# Patient Record
Sex: Female | Born: 1970 | Race: White | Hispanic: No | Marital: Married | Smoking: Current every day smoker
Health system: Southern US, Community
[De-identification: ages and names within clinical notes are randomized; demographics above are authoritative.]

## PROBLEM LIST (undated history)

## (undated) DIAGNOSIS — D649 Anemia, unspecified: Secondary | ICD-10-CM

## (undated) DIAGNOSIS — E079 Disorder of thyroid, unspecified: Secondary | ICD-10-CM

## (undated) DIAGNOSIS — J45909 Unspecified asthma, uncomplicated: Secondary | ICD-10-CM

## (undated) DIAGNOSIS — E785 Hyperlipidemia, unspecified: Secondary | ICD-10-CM

## (undated) DIAGNOSIS — I2699 Other pulmonary embolism without acute cor pulmonale: Secondary | ICD-10-CM

## (undated) DIAGNOSIS — F419 Anxiety disorder, unspecified: Secondary | ICD-10-CM

## (undated) DIAGNOSIS — R42 Dizziness and giddiness: Secondary | ICD-10-CM

## (undated) DIAGNOSIS — M1990 Unspecified osteoarthritis, unspecified site: Secondary | ICD-10-CM

## (undated) DIAGNOSIS — K219 Gastro-esophageal reflux disease without esophagitis: Secondary | ICD-10-CM

## (undated) DIAGNOSIS — J189 Pneumonia, unspecified organism: Secondary | ICD-10-CM

## (undated) HISTORY — DX: Pneumonia, unspecified organism: J18.9

## (undated) HISTORY — DX: Other pulmonary embolism without acute cor pulmonale: I26.99

## (undated) HISTORY — PX: BRACHIAL EMBOLECTOMY: SHX1259

## (undated) HISTORY — DX: Hyperlipidemia, unspecified: E78.5

## (undated) HISTORY — DX: Dizziness and giddiness: R42

## (undated) HISTORY — DX: Unspecified asthma, uncomplicated: J45.909

## (undated) HISTORY — DX: Anemia, unspecified: D64.9

## (undated) HISTORY — PX: COLONOSCOPY W/ POLYPECTOMY: SHX1380

## (undated) HISTORY — PX: CHOLECYSTECTOMY: SHX55

## (undated) HISTORY — DX: Anxiety disorder, unspecified: F41.9

## (undated) HISTORY — DX: Gastro-esophageal reflux disease without esophagitis: K21.9

## (undated) HISTORY — DX: Unspecified osteoarthritis, unspecified site: M19.90

## (undated) HISTORY — DX: Disorder of thyroid, unspecified: E07.9

## (undated) HISTORY — PX: CARPAL TUNNEL RELEASE: SHX101

## (undated) HISTORY — PX: WISDOM TOOTH EXTRACTION: SHX21

## (undated) HISTORY — PX: EMBOLECTOMY: SHX44

---

## 2010-08-02 ENCOUNTER — Emergency Department (HOSPITAL_COMMUNITY): Payer: Medicaid Other

## 2010-08-02 ENCOUNTER — Inpatient Hospital Stay (HOSPITAL_COMMUNITY): Payer: Medicaid Other

## 2010-08-02 ENCOUNTER — Inpatient Hospital Stay (HOSPITAL_COMMUNITY)
Admission: EM | Admit: 2010-08-02 | Discharge: 2010-08-08 | DRG: 253 | Disposition: A | Discharge status: Home Health | Payer: Medicaid Other | Attending: Plastic Surgery | Admitting: Plastic Surgery

## 2010-08-02 ENCOUNTER — Other Ambulatory Visit: Payer: Self-pay | Admitting: Vascular Surgery

## 2010-08-02 DIAGNOSIS — M629 Disorder of muscle, unspecified: Secondary | ICD-10-CM

## 2010-08-02 DIAGNOSIS — IMO0002 Reserved for concepts with insufficient information to code with codable children: Secondary | ICD-10-CM | POA: Diagnosis present

## 2010-08-02 DIAGNOSIS — I742 Embolism and thrombosis of arteries of the upper extremities: Principal | ICD-10-CM | POA: Diagnosis present

## 2010-08-02 DIAGNOSIS — F17200 Nicotine dependence, unspecified, uncomplicated: Secondary | ICD-10-CM | POA: Diagnosis present

## 2010-08-02 DIAGNOSIS — I743 Embolism and thrombosis of arteries of the lower extremities: Secondary | ICD-10-CM

## 2010-08-02 LAB — DIFFERENTIAL
Eosinophils Absolute: 0.1 10*3/uL (ref 0.0–0.7)
Lymphocytes Relative: 27 % (ref 12–46)
Lymphs Abs: 4.5 10*3/uL — ABNORMAL HIGH (ref 0.7–4.0)
Neutrophils Relative %: 63 % (ref 43–77)

## 2010-08-02 LAB — BASIC METABOLIC PANEL
Calcium: 8.4 mg/dL (ref 8.4–10.5)
Chloride: 104 mEq/L (ref 96–112)
Creatinine, Ser: 0.49 mg/dL (ref 0.4–1.2)
GFR calc Af Amer: >60 mL/min (ref >60)
GFR calc non Af Amer: >60 mL/min (ref >60)

## 2010-08-02 LAB — CBC
MCV: 93.1 fL (ref 78.0–100.0)
Platelets: 285 10*3/uL (ref 150–400)
RBC: 4.05 MIL/uL (ref 3.87–5.11)
WBC: 16.9 10*3/uL — ABNORMAL HIGH (ref 4.0–10.5)

## 2010-08-02 LAB — MRSA PCR SCREENING: MRSA by PCR: NEGATIVE

## 2010-08-02 LAB — PROTIME-INR: INR: 1.07 (ref 0.00–1.49)

## 2010-08-02 LAB — APTT: aPTT: 47 seconds — ABNORMAL HIGH (ref 24–37)

## 2010-08-02 MED ORDER — IOHEXOL 300 MG/ML  SOLN
250.0000 mL | Freq: Once | INTRAMUSCULAR | Status: AC | PRN
  Administered 2010-08-02: 170 mL via INTRAVENOUS

## 2010-08-03 DIAGNOSIS — I742 Embolism and thrombosis of arteries of the upper extremities: Secondary | ICD-10-CM

## 2010-08-03 LAB — CBC
Hemoglobin: 12.4 g/dL (ref 12.0–15.0)
Platelets: 280 10*3/uL (ref 150–400)
RBC: 3.86 MIL/uL — ABNORMAL LOW (ref 3.87–5.11)
WBC: 14.8 10*3/uL — ABNORMAL HIGH (ref 4.0–10.5)

## 2010-08-03 LAB — BASIC METABOLIC PANEL
Chloride: 101 mEq/L (ref 96–112)
Potassium: 3.4 mEq/L — ABNORMAL LOW (ref 3.5–5.1)

## 2010-08-03 LAB — HEPARIN LEVEL (UNFRACTIONATED)
Heparin Unfractionated: <0.1 IU/mL — ABNORMAL LOW (ref 0.30–0.70)
Heparin Unfractionated: <0.1 IU/mL — ABNORMAL LOW (ref 0.30–0.70)

## 2010-08-03 LAB — SEDIMENTATION RATE: Sed Rate: 43 mm/hr — ABNORMAL HIGH (ref 0–22)

## 2010-08-03 NOTE — Op Note (Signed)
[name], [name]             ACCOUNT NO.:  [id number]  MEDICAL RECORD NO.:  [id number]           PATIENT TYPE:  I  LOCATION:  3302                         FACILITY:  [facility]  PHYSICIAN:  Di Kindle. Edilia Bo, M.D.DATE OF BIRTH:  06-22-1970  DATE OF PROCEDURE:  08/02/2010 DATE OF DISCHARGE:                              OPERATIVE REPORT   PREOPERATIVE DIAGNOSIS:  Ischemic left hand.  POSTOPERATIVE DIAGNOSIS:  Ischemic left hand with embolus to the left brachial artery, left radial ulnar, and left ulnar artery and radial artery.  PROCEDURES: 1. Left brachial embolectomy. 2. Left radial and ulnar embolectomy with vein patch angioplasty of     the left brachial artery extending onto the ulnar artery. 3. Intraoperative arteriogram x2.  SURGEON:  Di Kindle. Edilia Bo, MD  ANESTHESIA:  General.  INDICATIONS:  This is a 40 year old woman who apparently had been having ischemic symptoms in her left hand for 4 days.  She presented to an outlying emergency department with a profoundly ischemic left hand with some mottling and decreased motor and sensory function and no Doppler flow in the left hand.  She was transferred to [facility] where she was evaluated by Dr. Arbie Cookey who set her up for an arteriogram which showed a large clot sitting in the brachial artery with very poor visualization distally.  She was taken to the operating room for urgent brachial embolectomy.  It was discussed with the patient prior to the procedure given the profound ischemia certainly there was risk of limb loss.  TECHNIQUE:  The patient was taken to the operating room and received a general anesthetic.  The left upper extremity was prepped and draped in the usual sterile fashion.  A longitudinal incision was made over the brachial artery and dissection carried down to the brachial artery. This was above the antecubital level.  The patient was then heparinized. A transverse arteriotomy was made in  the brachial artery and there was a large amount of fresh clot present.  A proximal embolectomy was performed using a #3 Fogarty catheter and a large amount of clot was retrieved.  The catheter was passed multiple times until there was no further clot retrieved on multiple passes of the catheter.  Catheter was then passed distally and I was able to pass this over 20 cm and a large amount of clot was retrieved with poor backbleeding.  The arteriotomy was then closed after no further clot was retrieved and the artery had been irrigated with heparinized saline and this was done with interrupted 6-0 Prolene sutures not to narrow the transverse arteriotomy.  At the completion, there was no Doppler flow at the wrist. I shot an intraoperative arteriogram which showed what appeared to be possibly an interosseous branch being the only patent branch with no good runoff onto the hand.  I did try to shoot a retrograde arteriogram to evaluate the proximal subclavian artery, but it was unable to push enough of contrast up to this level to visualize this on the x-ray. Given that there was no Doppler flow in the hand, I elected to extend the incision using a Z-plasty across the antecubital space  and the distal brachial artery was dissected free to where it trifurcated into a large dominant ulnar branch, radial branch, and interosseous branch. The smaller of the branches and the lateral branch appeared to be chronically occluded.  The radial artery, I was able to pass the catheter approximately 10-15 cm before meeting what appeared to be a chronic obstruction.  The catheter passed approximately 22 cm down the ulnar branch.  No further clot was retrieved.  I had controlled all branches and made a longitudinal arteriotomy in the distal brachial artery extending slightly onto the ulnar artery.  Again, a #4 Fogarty catheter was passed proximally and no further clot was retrieved and it was passed down the  radial and ulnar arteries multiple times with no further clot retrieved.  These vessels were irrigated with heparinized saline.  There was a branch of the basilic vein which I ligated between ties and opened this longitudinally to use as a vein patch.  The vein patch was sewn using continuous 6-0 Prolene suture.  At the completion, there was a radial and ulnar signal with the Doppler.  At this point of the procedure, Dr. Noelle Penner performed a fasciotomy as dictated separately. At the completion of this procedure, there remained a radial and ulnar signal with the Doppler.  All needle and sponge counts were correct.     Di Kindle. Edilia Bo, M.D.     CSD/MEDQ  D:  08/02/2010  T:  08/03/2010  Job:  [id number]  Electronically Signed by Waverly Ferrari M.D. on 08/03/2010 08:00:58 AM

## 2010-08-03 NOTE — H&P (Signed)
[name], [name]             ACCOUNT NO.:  [id number]  MEDICAL RECORD NO.:  [id number]           PATIENT TYPE:  E  LOCATION:  [facility]                         FACILITY:  [facility]  PHYSICIAN:  Larina Earthly, M.D.    DATE OF BIRTH:  Apr 30, 1970  DATE OF ADMISSION:  08/02/2010 DATE OF DISCHARGE:                             HISTORY & PHYSICAL   ADMISSION DIAGNOSIS:  Severe ischemia of left hand.  HISTORY OF PRESENT ILLNESS:  [name] is a 40 year old white female with a long history of left hand ischemic symptoms.  She reports that she has had approximately 5-year history of transient intermittent coolness and numbness in her left hand.  She has been diagnosed with Raynaud syndrome.  This is somewhat atypical and it has been present for approximately 5 years and that it last for approximately 1 week.  There is no relationship to cold exposure or emotional exposure.  She reports that she begins to have itching in her palm of her hand and then will have approximately 1 week of coolness and numbness in her fingertips in the left hand.  She has never had any association with her right hand or feet.  On this episode, which has been going on for about 4 or 5 days, she had similar onset, but has had progression and presented to the [facility] emergency room with severe pain in her left hand, was called at 4 a.m. from [facility] emergency department.  Dr. Earl Gala in the ED there said that she had no flow into her hand with her ischemic hand and was recommended that she be transferred immediately to [facility] for further evaluation and potential intervention.  She has had severe pain now so the point of having no sensation in her hand.  She was started on heparin and Cardene drip at [place] before transport.  PAST MEDICAL HISTORY:  Negative for cardiac disease.  She has had some recent dizziness.  She denies any connective tissue disorder.  She is hypothyroid and is on medications for  gastroesophageal reflux and hyperlipidemia.  SOCIAL HISTORY:  Positive for cigarette smoking.  MEDICATIONS:  Synthroid, aspirin, Nexium, simvastatin, Percocet, and nifedipine.  REVIEW OF SYSTEMS:  Negative except for history of present illness above.  PHYSICAL EXAM:  GENERAL:  Well-developed, well-nourished white female with left hand pain, otherwise in no distress. HEENT:  Normal. VITAL SIGNS:  Her blood pressure is 143/78, heart rate is 82, respirations 18. EXTREMITIES:  Her right hand has normal color and normal sensation.  Has a normal radial and ulnar pulse on the right.  Her left hand, she does have a palpable brachial pulse above the antecubital space and no pulses distal.  She has profound ischemia with extreme mottling and contracture in her left hand.  She is able to barely move her fingers, cannot open her hand, and has no sensation in her hand up to her palm.  She does have a sensation above her wrist.  Her lower extremities reveal normal dorsalis pedis pulses bilaterally with no evidence of ischemia. HEART:  Regular rate and rhythm. CHEST:  Clear bilaterally. ABDOMEN:  Mildly obese and nontender.  MUSCULOSKELETAL:  No major deformities or cyanosis. NEUROLOGIC:  Grossly intact.  LABORATORY DATA:  From [facility], her creatinine is 0.54, her potassium is 3.7.  Her white count is 20.3, H and H is 13 and 40.  Her total CPK is 598 and her severe myoglobin is 308.  IMPRESSION:  Profound ischemia of left hand in a right-hand dominant 53- year-old, etiology is somewhat unclear with questionable history of vasospastic disorder in her left hand.  I discussed her long-term absolutely critical cessation of cigarette smoking.  Acutely, she will spoke with Dr. Maryclare Bean, Interventional radiology.  She will undergo urgent arch and left arm arteriogram today for both diagnosis and also to potentially have infusion therapy depending on the etiology of spasm versus occlusion.  I did  explain to [name] that this is certainly a limb threatening situation and we may need to consult Hand Surgery as well with her profound ischemia.     Larina Earthly, M.D.     TFE/MEDQ  D:  08/02/2010  T:  08/02/2010  Job:  [id number]  Electronically Signed by TODD EARLY M.D. on 08/03/2010 04:04:03 PM

## 2010-08-04 ENCOUNTER — Other Ambulatory Visit: Payer: Self-pay | Admitting: Plastic Surgery

## 2010-08-04 LAB — CBC
MCH: 32.2 pg (ref 26.0–34.0)
MCV: 94 fL (ref 78.0–100.0)
Platelets: 252 10*3/uL (ref 150–400)
RBC: 3.51 MIL/uL — ABNORMAL LOW (ref 3.87–5.11)
RDW: 13.4 % (ref 11.5–15.5)
WBC: 16.9 10*3/uL — ABNORMAL HIGH (ref 4.0–10.5)

## 2010-08-04 LAB — PROTIME-INR: Prothrombin Time: 13.5 seconds (ref 11.6–15.2)

## 2010-08-05 ENCOUNTER — Inpatient Hospital Stay (HOSPITAL_COMMUNITY): Payer: Medicaid Other

## 2010-08-05 HISTORY — PX: ABOVE ELBOW ARM AMPUTATION: SUR27

## 2010-08-05 LAB — CBC
HCT: 31 % — ABNORMAL LOW (ref 36.0–46.0)
MCHC: 34.8 g/dL (ref 30.0–36.0)
MCV: 94.5 fL (ref 78.0–100.0)
Platelets: 281 10*3/uL (ref 150–400)
RDW: 13.6 % (ref 11.5–15.5)
WBC: 17.7 10*3/uL — ABNORMAL HIGH (ref 4.0–10.5)

## 2010-08-05 MED ORDER — IOHEXOL 300 MG/ML  SOLN: 100.0000 mL | Freq: Once | INTRAMUSCULAR | Status: AC | PRN

## 2010-08-06 LAB — CBC
MCV: 94.4 fL (ref 78.0–100.0)
Platelets: 302 10*3/uL (ref 150–400)
RBC: 3.21 MIL/uL — ABNORMAL LOW (ref 3.87–5.11)
RDW: 13.2 % (ref 11.5–15.5)
WBC: 14.4 10*3/uL — ABNORMAL HIGH (ref 4.0–10.5)

## 2010-08-06 LAB — TYPE AND SCREEN
ABO/RH(D): A POS
Antibody Screen: POSITIVE
DAT, IgG: NEGATIVE
Donor AG Type: NEGATIVE
Donor AG Type: NEGATIVE
PT AG Type: NEGATIVE
Unit division: 0
Unit division: 0

## 2010-08-06 LAB — BASIC METABOLIC PANEL
BUN: 6 mg/dL (ref 6–23)
Chloride: 99 mEq/L (ref 96–112)
Potassium: 3.2 mEq/L — ABNORMAL LOW (ref 3.5–5.1)
Sodium: 135 mEq/L (ref 135–145)

## 2010-08-07 LAB — CBC
HCT: 29.4 % — ABNORMAL LOW (ref 36.0–46.0)
Hemoglobin: 9.9 g/dL — ABNORMAL LOW (ref 12.0–15.0)
MCH: 31.7 pg (ref 26.0–34.0)
MCHC: 33.7 g/dL (ref 30.0–36.0)
MCV: 94.2 fL (ref 78.0–100.0)
Platelets: 332 K/uL (ref 150–400)
RBC: 3.12 MIL/uL — ABNORMAL LOW (ref 3.87–5.11)
RDW: 13.1 % (ref 11.5–15.5)
WBC: 15.7 K/uL — ABNORMAL HIGH (ref 4.0–10.5)

## 2010-08-08 LAB — CBC
HCT: 27.9 % — ABNORMAL LOW (ref 36.0–46.0)
Hemoglobin: 9.3 g/dL — ABNORMAL LOW (ref 12.0–15.0)
MCH: 31.4 pg (ref 26.0–34.0)
MCHC: 33.3 g/dL (ref 30.0–36.0)
MCV: 94.3 fL (ref 78.0–100.0)
RBC: 2.96 MIL/uL — ABNORMAL LOW (ref 3.87–5.11)

## 2010-09-08 NOTE — Op Note (Signed)
[name], [name]             ACCOUNT NO.:  [id number]  MEDICAL RECORD NO.:  [id number]           PATIENT TYPE:  LOCATION:                                 FACILITY:  PHYSICIAN:  Loreta Ave, MD DATE OF BIRTH:  Jun 05, 1970  DATE OF PROCEDURE:  08/04/2010 DATE OF DISCHARGE:                              OPERATIVE REPORT   PREOPERATIVE DIAGNOSIS:  Ischemic left hand.  POSTOPERATIVE DIAGNOSIS:  Ischemic left hand.  PROCEDURE PERFORMED:  Left above-elbow arm amputation.  SURGEON:  Loreta Ave, MD  ASSISTANT:  None.  ESTIMATED BLOOD LOSS:  20 mL.  COMPLICATIONS:  None.  SPECIMEN:  One left arm.  DRAINS:  One Jackson-Pratt.  CLINICAL INDICATIONS:  [name] is a 40 year old right-hand- dominant female who suffered from an ischemic left hand several days ago.  Upon being evaluated by the Vascular Surgery Service, she was taken to the operating room where thrombectomy of the left upperextremity was performed as well as fasciotomies of the left hand and forearm.  She then was returned to the ICU for observation. Subsequently, there was persistent ischemia and a decrease in arterial inflow to the left forearm evidenced by loss of Doppler signal of the left radial and ulnar arteries at the wrist.  I reviewed the patient's clinical examination and Doppler findings with her as well as with Vascular Surgery and recommended amputation.  The patient understood the risks of surgery to include but not be limited to bleeding, infection, damage to nearby structures, obvious loss of function, as well as potentially the need for more surgery, especially more proximal amputation.  She desired to proceed.  DESCRIPTION OF OPERATION:  The patient was brought to the operating room and placed in supine position on the operating room table.  Ancef 1 g was given.  SCDs were on approximately at the induction of anesthesia. After smooth and routine induction of general  anesthesia, the left upper extremity was prepped with chlorhexidine and draped into a sterile field at the level of the axilla.  The arm was inspected and the volar forearm musculature was all necrotic as was the extensor musculature of the forearm.  The skin was incised overlying the proximal third of the forearm in an attempt to do a below-elbow amputation; however, there was no bleeding of the skin at this level.  Next, a fishmouth incision was designed to allow resection of the humerus approximately 7 cm proximal to the elbow.  The skin was incised anteriorly with a 10 blade, and electrocautery was carried down through the flexor musculature.  The median nerve was placed on traction and divided with electrocautery. Large vessels were individually clamped, divided, and cured with 3-0 Vicryl ties.  Next, moving medially the ulnar nerve was encountered and was placed on tension and divided with electrocautery.  Next, the posterior portion of the incision was made with a 10 blade and the radial nerve was placed on traction and divided with electrocautery and the extensor musculature was divided with electrocautery.  Next, the humerus was divided with an oscillating saw.  Bone ends were smoothed with a rongeur and rasp.  Next, the wound was  irrigated copiously with normal saline, and the anterior, flexor musculature was secured to the extensor musculature with interrupted Vicryl pop-off sutures.  Next, the anterior skin was cut to remove all previous incisions from her thrombectomy surgical site.  The posterior skin was pulled over and sewn loosely with interrupted 2-0 nylon sutures as well as staples.  Prior to closing the skin, a 19-French drain was placed via separate stab incision and secured to the skin with a nylon suture.  A 10 mL of 0.5% Marcaine plain were then injected about the surgical site for postoperative analgesia.  The drain was charged, and Xeroform as well as dry sterile  dressing and an Ace wrap were placed on the left upper extremity.  Sponge and needle counts were reported as correct x2.  The patient was then extubated and transferred to the recovery room in stable condition.     Loreta Ave, MD     CF/MEDQ  D:  08/04/2010  T:  08/05/2010  Job:  [id number]  Electronically Signed by Loreta Ave MD on 09/08/2010 02:12:20 PM

## 2010-09-08 NOTE — Op Note (Signed)
NAMEHUNTER, PINKARD             ACCOUNT NO.:  [id number]  MEDICAL RECORD NO.:  [id number]           PATIENT TYPE:  I  LOCATION:  3302                         FACILITY:  [facility]  PHYSICIAN:  Loreta Ave, MD DATE OF BIRTH:  02/19/1971  DATE OF PROCEDURE:  08/02/2010 DATE OF DISCHARGE:                              OPERATIVE REPORT   PREOPERATIVE DIAGNOSIS:  Ischemic left hand.  POSTOPERATIVE DIAGNOSIS:  Ischemic left hand.  PROCEDURE PERFORMED: 1. Flexor and extensor forearm fasciotomies. 2. Fasciotomies of the left hand. 3. Carpal tunnel release.  SURGEON:  Loreta Ave, MD.  ASSISTANT:  Tonye Becket, NP.  TOURNIQUET:  Not utilized.  ESTIMATED BLOOD LOSS:  10 mL.  CLINICAL INDICATION:  [name] is a 40 year old female with a 3-4 day history of left upper extremity ischemia.  She presented last night to an outside hospital and was referred to [facility] for definitive care.  She was seen this morning by Dr. Gretta Began, who performed an angiogram revealing clot in the brachial artery.  Her care was then assumed by [name] of Vascular Surgery who performed a embolectomies of the left upper extremity and the brachial, radial, and ulnar arteries.  After he established improved blood flow to the hand via fasciotomies, I was called into the room.  The patient understood the need for fasciotomies and the risks of surgery.  She understood the risks of surgery to include, but not be limited to bleeding, infection, damage to nearby structures, the need for blood products, stiffness, scarring, loss of function, as well as potentially the need for left upper extremity amputation.  She desired to proceed.  DESCRIPTION OF OPERATION:  I was called into the operating room after [name] had performed his embolectomy with increased blood flow to left upper extremity.  After scrubbing in, I listened two biphasic radial and ulnar arterial Doppler signals at the  wrist.  No digital arterial or venous Doppler signals could be located in the hand or fingers.  The hand was more pink than preoperatively and warmer.  The thumb appeared to be the most ischemic of the digits.  Next, attention was turned to hand fasciotomies.  A longitudinal incision was made along the ulnar border of the hypothenar eminence at the glabrous junction. This was done with a 15 blade and the hypothenar musculature was incised along its fascia and found to be relatively pink with active bleeding. Next, a longitudinal incision was made along the glabrous junction of the thumb metacarpal on the radial side of the hand.  This was done with a #15-blade and superficial vessels were controlled with electrocautery. The thenar eminence was then incised revealing active bleeding, however, a more gray muscle color.  Next, attention was turned to the index finger metacarpal, which was the skin overlying it was incised with a #15-blade on the dorsum of the hand.  Next, the first and second web spaces were incised with tenotomy scissors, cutting the fascia.  The musculature here was actively bleeding, but relatively gray.  Next, a longitudinal incision was made overlying the ring finger metacarpal on the dorsum of the hand and superficial  vessels were again controlled with electrocautery.  The fascia on either side of the ring finger was incised, revealing active bleeding and relatively pink musculature in the third and fourth web spaces.  Next, a 10-cm longitudinal incision was made along the extensor musculature in the proximal forearm.  This revealed active bleeding and pink musculature once the fascia of the extensor wad was incised.  Next, a lazy-S incision was made from the proximal and ulnar portion of the forearm coursing radially and distally and then backed ulnarly in standard fashion.  It was then continued into the palm along a standard carpal tunnel incision line.  The distal  port of the incision was incised with a #15-blade and superficial vessels controlled with electrocautery.  The transverse carpal ligament was identified and pierced in its mid substance with a #15-blade.  No injury was made to the median nerve.  Next, transverse carpal ligament was incised distally under direct vision with tenotomy scissors to the level of the palmar fat pad.  Next, it was divided in stepwise fashion with tenotomy scissors, moving into the proximal forearm for approximately 3 cm.  Once releasing the transverse carpal ligament, the remainder of the incision was made along the forearm and carried down to the level of the flexor fascia.  Next, the fascia of the flexor wad was incised longitudinally with tenotomy scissors.  Most of the musculature of the volar forearm was relatively gray and appeared nonviable.  Next, the wounds were irrigated with normal saline.  The previous wound along the volar aspect of the elbow made bypass with surgery, was closed with interrupted 3-0 nylon sutures.  Approximately three interrupted simple 2-0 nylon sutures were placed to loosely close the volar incision.  A care was taken to cover the median nerve at the wrist.  Next, the remainder of the wounds were covered with Xeroform, dry gauze, and a Kerlix wrap which was loosely applied.  Estimated blood loss was approximately 10 mL.  Sponge and needle counts reported as correct x2.  The patient was then extubated and transferred to the recovery room in stable condition.     Loreta Ave, MD     CF/MEDQ  D:  08/02/2010  T:  08/03/2010  Job:  [id number]  Electronically Signed by Loreta Ave MD on 09/08/2010 02:12:17 PM

## 2010-09-08 NOTE — Discharge Summary (Signed)
[name], [name]             ACCOUNT NO.:  [id number]  MEDICAL RECORD NO.:  [id number]           PATIENT TYPE:  I  LOCATION:  5035                         FACILITY:  [facility]  PHYSICIAN:  Loreta Ave, MD DATE OF BIRTH:  10/31/1970  DATE OF ADMISSION:  08/02/2010 DATE OF DISCHARGE:  08/08/2010                              DISCHARGE SUMMARY   PRIMARY ADMITTING DIAGNOSIS:  Ischemic left hand.  DISCHARGE DIAGNOSIS:  Ischemic left hand.  LABORATORY DATA:  CBC on Aug 02, 2010, white blood count was 16.9, hemoglobin was 12.8, hematocrit was 37.7, platelets 285.  PT 14.1, INR 1.07, PTT 47.  On Aug 08, 2010, CBC, white blood count 13.2, hemoglobin was 9.3, hematocrit was 27.9, platelets 352.  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  On Aug 02, 2010, intraoperative left extremity arteriogram, patent interosseous artery extending to wrist.  On Aug 05, 2010, left hand CT angiogram of the chest with contrast, proximal left subclavian artery with tubular filling defect compatible with embolus or thrombus, but there is no evidence of a visible dissection.  LINES:  PICC line was inserted on Aug 03, 2010.  PICC line was discontinued on Aug 08, 2010.  SURGERIES: 1. Left brachial embolectomy, left radial and ulnar embolectomy with     vein patch on Aug 03, 2010, by [name]. 2. On Aug 02, 2010, flexor and extension forearm fasciotomy and     fasciotomies of left hand and carpal tunnel release. 3. On Aug 04, 2010, left above elbow arm amputation by Dr. Noelle Penner.  HISTORY OF HOSPITAL ADMISSION:  [name] is a 40 year old right-hand dominant female that was admitted to [facility] with a left ischemic hand.  She has had a 5-year history of intermittent problems with her left hand with coolness and numbness four days prior to admission.  She reported coolness and numbness of her left hand and to follow up  with her primary physician, and at that time it was felt to be a nerve impingement.  However, on discharge she was sent to the emergency room at [facility] for evaluation of her left hand and they transferred her to [facility] for evaluation by vascular surgeon and hand surgeon.  [name] did evaluate her for her ischemic left hand, and at that time he consulted Dr. Noelle Penner for evaluation of her left hand.  HOSPITAL COURSE:  [name] was admitted to [facility] on Aug 02, 2010, with an ischemic left hand, and at that time Vascular Surgery evaluated her for this and they recommended that she be evaluated by Dr. Noelle Penner.  She was taken to the operating room on Aug 02, 2010, by [name] for a left brachial embolectomy and a left radial ulnar embolectomy and an intraoperative arteriogram x2 and she did have extensive clot noted; however, at the conclusion of Dr. Adele Dan procedure she did have good flow with radial and ulnar signal, this was followed by Dr. Noelle Penner in the operating room and he completed fasciotomies of her left hand; however, she did have some decreased capillary  refill of her left hand with coolness noted of the completion of the operation.  She was started on IV heparin.  She was later transferred to the step-down unit for observation.  On postoperative day 1, she did not a signal of her radial or ulnar artery, and Dr. Arbie Cookey and Dr. Noelle Penner did not feel that limb salvage was possible at that time and that she would require an amputation due to devascularized left hand. She was also noted to have decreased sensation in her left hand with swelling and the musculature in the forearm was pale and gray.  She also had a PICC line placed on that day, and on Aug 04, 2010, she returned to the operating room for left upper extremity amputation.  She did have a JP placed at that time.  She continued on a PCA, and she again returned to the step-down unit for closer  observation.  On postoperative day 1, her pain was managed with a PCA morphine and OxyContin was initiated to assist with pain control.  Her JP had 20 mL of serosanguineous fluid out for 24 hours, and her dressing remained dry and intact, and at that time an OT consul was initiated.  Smoking cessation was discussed, and she began ambulation.  On postoperative day 1, she did have a repeat CT angiogram and it did show a filling defect in her left subclavian but no dissection was noted.  At that time, Vascular recommended no further treatment.  On postoperative day 3, her PCA was discontinued and her pain was being managed with Percocet and OxyContin.  She had decreased JP drainage.  Therefore, the JP was discontinued with no evidence of erythema or purulence at the incision site of her left arm amputation. On postoperative day 4, her vital signs remained stable.  She remained afebrile.  Her pain was tolerated well with Percocet and OxyContin.  Her left upper arm incision remained approximated with minimal serous exudate on the posterior aspect of her upper arm.  Erythema was noted, but not in the immediate area of the incision.  Questionable cellulitis in this area.  She is to continue daily dressing changes, and she will receive home health RN occupational therapist.  Her PICC line was also discontinued on this day, and she was tolerating a regular diet, voiding spontaneously, and she had a bowel movement.  Therefore, she was prepared for discharge in stable condition.  DISCHARGE INSTRUCTIONS: 1. Activity:  She is made to increase activity slowly.  She may shower     and no driving while taking narcotics.  She is to be on a regular     diet. 2. Wound care:  Left upper arm Xeroform to incision, Kerlix and Ace     wrap daily. 3. She is to stop smoking.  DISCHARGE MEDICATIONS: 1. Clindamycin 150 mg by mouth every 8 hours. 2. Nystatin oral suspension 4-6 mL by mouth 4 times a day. 3.  Nifedipine 10 mg by mouth twice daily. 4. Oxycodone 5/325 one to two tablets by mouth every 4 hours as needed     for pain. 5. OxyContin 10 mg suspended release 2 tablets by mouth every 12     hours. 6. Aspirin 81 mg 1 tablet by mouth daily. 7. Levothroid 175 mcg 1 tablet by mouth daily. 8. Nexium 40 mg 1 capsule by mouth daily. 9. Simvastatin 40 mg 1 tablet by mouth daily. 10.Xanax 0.25 mg 1 tablet by mouth 3 times a day as needed for  anxiety.  She is to follow up with Dr. Noelle Penner on Aug 16, 2010, and call [phone] to make an appointment for that.  She is to only follow up with Vascular Surgery if she has any new vascular problems by calling [phone].  Home health:  This will be provided by [facility] for a RN for wound care and occupational therapy.  She is instructed to call Dr. Noelle Penner is she is experiencing any nausea, vomiting, chills, or pain not relieved with her pain medicine.  She is also to call Dr. Noelle Penner if she experiences additional redness of her left upper extremity or if she has any concerns about her left upper extremity.  TIME SPENT ON DISCHARGE:  Thirty minutes.     Tonye Becket, NP   ______________________________ Loreta Ave, MD    AC/MEDQ  D:  08/08/2010  T:  08/08/2010  Job:  [id number]  Electronically Signed by Loreta Ave MD on 09/08/2010 02:12:15 PM

## 2010-09-23 DIAGNOSIS — I743 Embolism and thrombosis of arteries of the lower extremities: Secondary | ICD-10-CM

## 2010-10-12 ENCOUNTER — Encounter (INDEPENDENT_AMBULATORY_CARE_PROVIDER_SITE_OTHER): Payer: Medicaid Other | Admitting: Vascular Surgery

## 2010-10-12 DIAGNOSIS — I742 Embolism and thrombosis of arteries of the upper extremities: Secondary | ICD-10-CM

## 2010-10-12 NOTE — Assessment & Plan Note (Signed)
OFFICE VISIT  [name], [name] DOB:  Jul 23, 1970                                       10/12/2010 [id number]#:[id number]  Patient had left brachial embolectomy, left radial and ulnar embolectomy, and vein patch angioplasty of the brachial artery and ulnar artery.  She had presented with an ischemic hand for 4 days and had a profoundly ischemic hand with mottling and decreased motor and sensory function.  Despite embolectomy, she ultimately required an amputation of the arm by Dr. Noelle Penner.  He has subsequently left town, so I have stated that I would be happy to follow her if she has any issues with her amputation.  She has had no pain associated with the amputation site. She is right-handed.  PHYSICAL EXAMINATION:  Blood pressure is 134/79, heart rate is 90.  The amputation stump of the left upper extremity is healed nicely and appears well-perfused.  She has a palpable right radial pulse.  Overall, it appears that this is healing adequately.  Again I have explained that I would be happy to see her if she has any issues with this in the future.  I have instructed her to take an aspirin daily.    Di Kindle. Edilia Bo, M.D. Electronically Signed  CSD/MEDQ  D:  10/12/2010  T:  10/12/2010  Job:  [id number]

## 2011-01-02 ENCOUNTER — Encounter: Payer: Self-pay | Admitting: Vascular Surgery

## 2011-01-03 ENCOUNTER — Encounter: Payer: Self-pay | Admitting: Vascular Surgery

## 2011-01-04 ENCOUNTER — Encounter: Payer: Self-pay | Admitting: Vascular Surgery

## 2011-01-04 ENCOUNTER — Ambulatory Visit (INDEPENDENT_AMBULATORY_CARE_PROVIDER_SITE_OTHER): Payer: Medicaid Other | Admitting: Vascular Surgery

## 2011-01-04 VITALS — BP 133/84 | HR 69 | Resp 16 | Ht 64.0 in | Wt 236.0 lb

## 2011-01-04 DIAGNOSIS — M79609 Pain in unspecified limb: Secondary | ICD-10-CM

## 2011-01-04 DIAGNOSIS — I2699 Other pulmonary embolism without acute cor pulmonale: Secondary | ICD-10-CM

## 2011-01-04 NOTE — Progress Notes (Signed)
[facility]  Patient name: [name] MRN: [id number] DOB: 1970/11/13 Sex: female  CC: followup after her left brachial embolectomy and amputation of the left arm  HPI: [name] is a 40 y.o. female who presented with a profoundly ischemic left arm. She underwent attempted embolectomy however the arm was not salvageable and ultimately Dr.Fender performed above the elbow amputation. At that time she did not have a hypercoagulable workup because of her acute medical condition. I last saw her in followup in July of 2012. Since I saw her last she has had a pulmonary embolus and is now been started on Coumadin. He states that she was on a trip to the beach and subsequently developed right-sided chest pain was found have a pulmonary embolus. She has not had any leg swelling. She has not really had any symptoms in her right arm. She does have some phantom pain in the left arm. Currently she does not have pleuritic chest pain or significant shortness of breath.  Past Medical History  Diagnosis Date  . Arthritis   . GERD (gastroesophageal reflux disease)   . Dizziness   . Thyroid disease   . Pneumonia 10-18-2010   hospitalized for 4 days  [facility]  . Pulmonary embolism 10-18-2010  hopitalized for 4 days   . Hyperlipidemia     Family History  Problem Relation Age of Onset  . Other Father     Visual merchandiser  . Stroke Father     SOCIAL HISTORY: History  Substance Use Topics  . Smoking status: Former Smoker -- 1.0 packs/day    Types: Cigarettes    Quit date: 08/01/2010  . Smokeless tobacco: Not on file  . Alcohol Use: No    Allergies  Allergen Reactions  . Terramycin Rash    Current Outpatient Prescriptions  Medication Sig Dispense Refill  . ALPRAZolam (XANAX) 0.25 MG tablet Take 0.25 mg by mouth at bedtime as needed.        Marland Kitchen aspirin 81 MG tablet Take 81 mg by mouth daily.        Marland Kitchen azithromycin (ZITHROMAX) 250 MG tablet Take 250 mg by  mouth daily.        Marland Kitchen esomeprazole (NEXIUM) 40 MG capsule Take 40 mg by mouth 2 (two) times daily.        Marland Kitchen levothyroxine (SYNTHROID, LEVOTHROID) 75 MCG tablet Take 75 mcg by mouth daily.        Marland Kitchen oxyCODONE-acetaminophen (PERCOCET) 10-325 MG per tablet Take 1 tablet by mouth every 6 (six) hours as needed.        . simvastatin (ZOCOR) 40 MG tablet Take 40 mg by mouth at bedtime.        Marland Kitchen warfarin (COUMADIN) 5 MG tablet Take 5 mg by mouth daily.          REVIEW OF SYSTEMS: Arly.Keller ] denotes positive finding; [  ] denotes negative finding CARDIOVASCULAR:  [ ]  chest pain   [ ]  chest pressure   [ ]  palpitations   [ ]  orthopnea   [ ]  dyspnea on exertion   [ ]  claudication   [ ]  rest pain   [ ]  DVT   [ ]  phlebitis [X]  PE as above PULMONARY:   [ ]  productive cough   [ ]  asthma   [ ]  wheezing NEUROLOGIC:   [ ]  weakness  [ ]  paresthesias  [ ]  aphasia  [ ]  amaurosis  [ ]  dizziness HEMATOLOGIC:   [ ]   bleeding problems   [ ]  clotting disorders MUSCULOSKELETAL:  [ ]  joint pain   [ ]  joint swelling GASTROINTESTINAL: [ ]   blood in stool  [ ]   Hematemesis [X]  GERD GENITOURINARY:  [ ]   dysuria  [ ]   hematuria PSYCHIATRIC:  [ ]  history of major depression INTEGUMENTARY:  [ ]  rashes  [ ]  ulcers CONSTITUTIONAL:  [ ]  fever   [ ]  chills  PHYSICAL EXAM: Filed Vitals:   01/04/11 1327  BP: 133/84  Pulse: 69  Resp: 16  Height: 5\' 4"  (1.626 m)  Weight: 236 lb (107.049 kg)   Body mass index is 40.51 kg/(m^2). GENERAL: The patient is a well-nourished female, in no acute distress. The vital signs are documented above. CARDIOVASCULAR: There is a regular rate and rhythm without significant murmur appreciated. I do not detect any carotid bruits. A palpable brachial and radial pulse on the right. He has palpable femoral pulses and dorsalis pedis pulses bilaterally. PULMONARY: There is good air exchange bilaterally without wheezing or rales. ABDOMEN: Soft and non-tender with normal pitched bowel sounds.  MUSCULOSKELETAL:  her above the elbow amputation on the left side has healed nicely. NEUROLOGIC: No focal weakness or paresthesias are detected. SKIN: There are no ulcers or rashes noted. PSYCHIATRIC: The patient has a normal affect.  MEDICAL ISSUES: Given that the patient had a arterial thrombotic event and now subsequently venous thrombotic event I think clearly she has evidence of a hypercoagulable state. She is currently on Coumadin. We discussed obtaining hematologic consult in [place] however she was referred to this closer to home and she will discuss this with her primary care physician. Suspect she will need lifelong Coumadin. Given her multiple events I would like to follow her closely. I have ordered ABIs in 6 months and also arterial study of the right upper extremity in 6 months. She does occasionally get pain in her legs in the right arm although currently she has palpable pulses. She knows to call sooner if she has problems.  Aleksis Jiggetts S [facility] [place] Office: [phone]

## 2011-07-11 ENCOUNTER — Encounter: Payer: Self-pay | Admitting: Neurosurgery

## 2011-07-12 ENCOUNTER — Ambulatory Visit (INDEPENDENT_AMBULATORY_CARE_PROVIDER_SITE_OTHER): Payer: Medicaid Other | Admitting: Neurosurgery

## 2011-07-12 ENCOUNTER — Encounter: Payer: Self-pay | Admitting: Neurosurgery

## 2011-07-12 VITALS — BP 108/73 | HR 76 | Resp 16 | Ht 63.0 in | Wt 243.5 lb

## 2011-07-12 DIAGNOSIS — M79609 Pain in unspecified limb: Secondary | ICD-10-CM

## 2011-07-12 DIAGNOSIS — I2699 Other pulmonary embolism without acute cor pulmonale: Secondary | ICD-10-CM | POA: Insufficient documentation

## 2011-07-12 DIAGNOSIS — T81718A Complication of other artery following a procedure, not elsewhere classified, initial encounter: Secondary | ICD-10-CM

## 2011-07-12 NOTE — Progress Notes (Signed)
Subjective:     Patient ID: [name], female   DOB: 01-18-1971, 41 y.o.   MRN: [id number]  HPI: Supple 41 year old patient with a history of a left upper extremity amputation Dr. Noelle Adkins almost 2 years ago. Been followed by Dr. Edilia Adkins for problems with the amputation site and pain. She states these have resolved she has a little final pain from time to time but overall is doing very well. Patient also states she did have a hematology workup the initially showed lupus however that was ruled out and she is told she has no clotting disorders even with a history of DVTs that she's had.   Review of Systems: 12 point review of systems notable for the difficulties described above otherwise unremarkable     Objective:   Physical Exam: Well-developed well-nourished obese female now almost 2 years status post left upper extremity amputation. Vital signs are stable amputation site is well-healed, she has no new significant medical problems. She does have a positive phalens and send Tinel's on the right and I've encouraged her to see her primary care doctor and possibly be referred for I nerve conduction study in the hand. He does have a 2+ radial pulse in the right hand     Assessment:     Assessment as above status post left upper extremity amputation doing well    Plan:     I spoke with Dr. Edilia Adkins, the patient can followup here when necessary basis her questions were encouraged and answered she is in agreement with this plan  [name] ANP   Clinic M.D.: [name]

## 2012-10-16 IMAGING — CR DG CHEST 1V PORT
1 series · 1 of 1 positions shown · non-contrast
Comparison: None.

CLINICAL DATA: History of pain.

PORTABLE CHEST - 1 VIEW

[view not recorded]
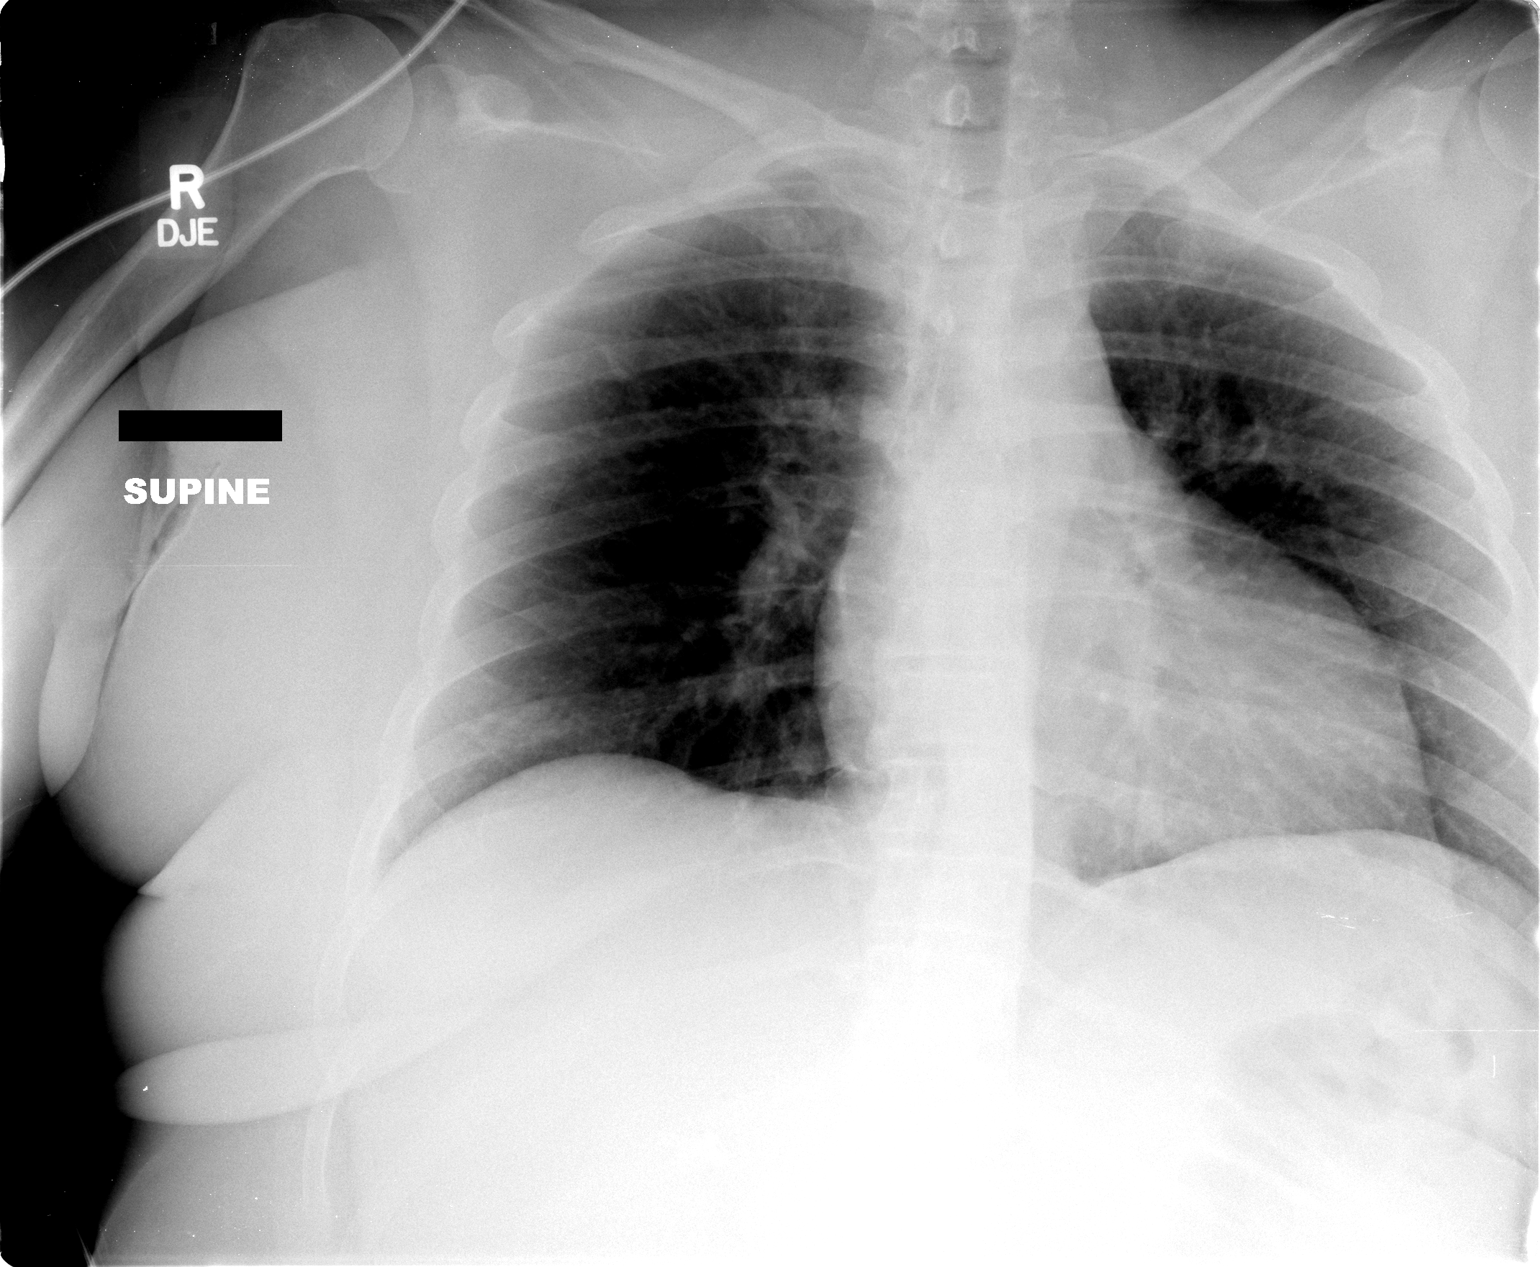

[1 of 1 positions shown; findings below may reference images not displayed]

FINDINGS: The cardiac silhouette is normal size and shape. The
lungs are well aerated and free of infiltrates. No pleural
abnormality is evident.  No mediastinal or hilar enlargement or
pleural effusion is seen.  Left costophrenic angle is clipped from
the image.
IMPRESSION: No cardiopulmonary abnormality or acute or active abnormal process
is seen.

## 2012-10-19 IMAGING — CT CT ANGIO CHEST
2 of 6 series · 19 of 46 positions shown · IV contrast (APPLIED)
Comparison: Catheter arteriogram 08/02/2010

CLINICAL DATA: Left hand and arm numbness.  Recent left arm
amputation.  Evaluate for thoracic dissection.

CT ANGIOGRAPHY CHEST WITH CONTRAST
TECHNIQUE: Multidetector CT imaging of the chest was performed
using the standard protocol during bolus administration of
intravenous contrast.  Multiplanar CT image reconstructions
including MIPs were obtained to evaluate the vascular anatomy.
Contrast:  100 ml Omnipaque 300 IV.

[Series 5: dissection 2.0 st · axial · 0.95mm/px · z∈[-358,-64]mm · 16 of 163 slices shown]
[im 8/163  lung]
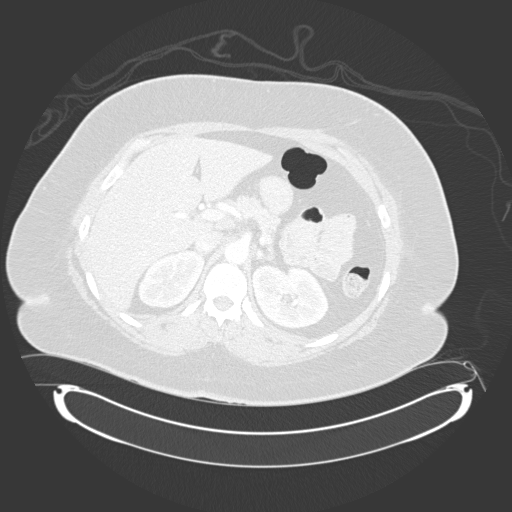
[im 22/163  soft-tissue]
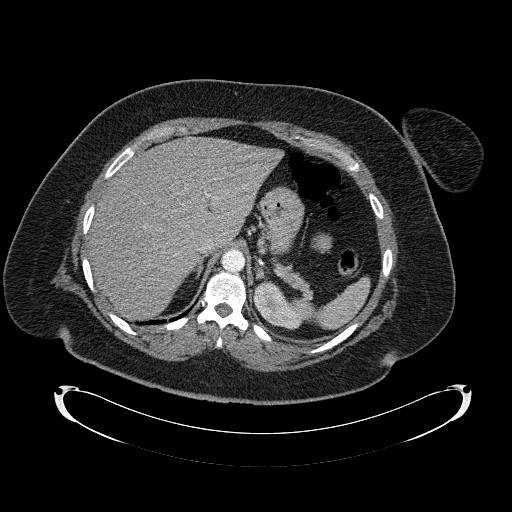
[im 29/163  lung]
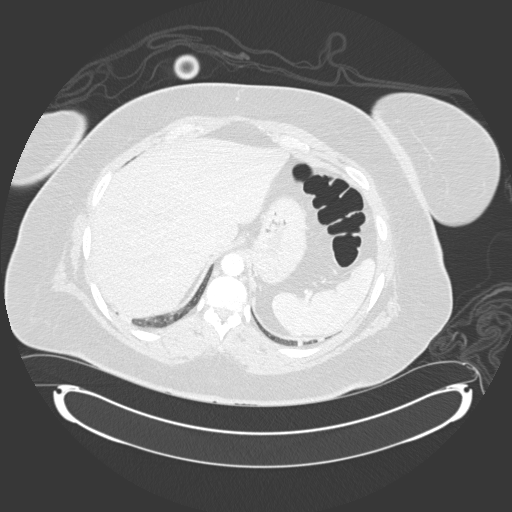
[im 36/163  soft-tissue]
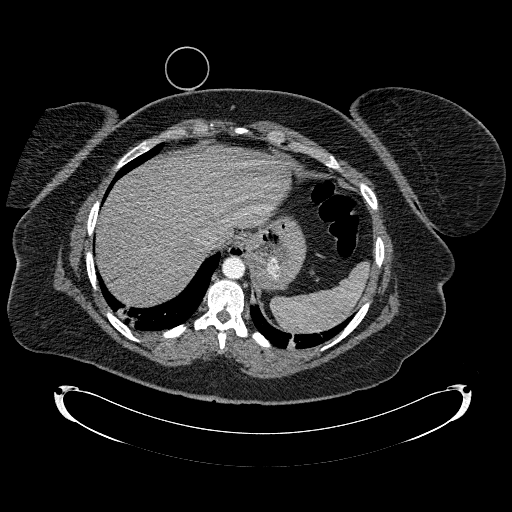
[im 50/163  lung]
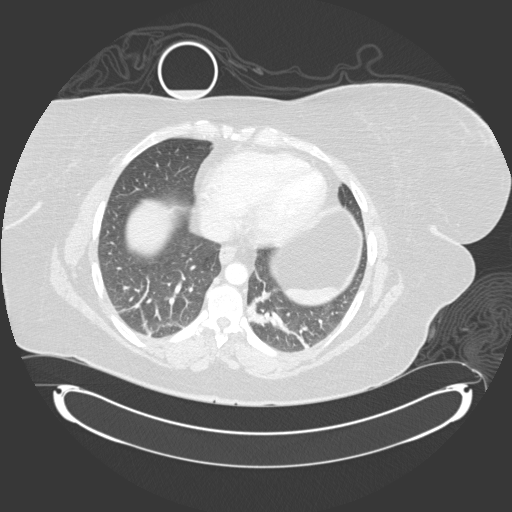
[im 57/163  soft-tissue]
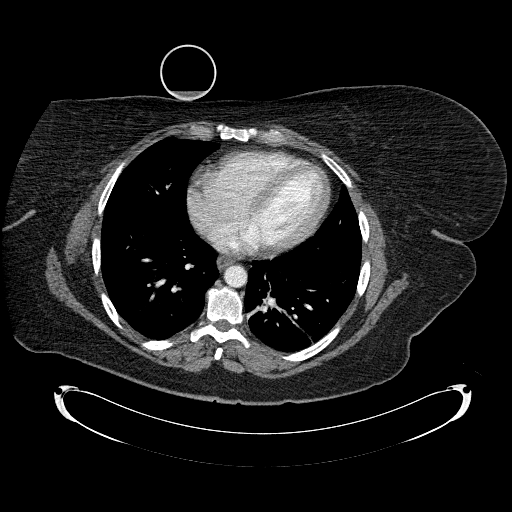
[im 64/163  lung]
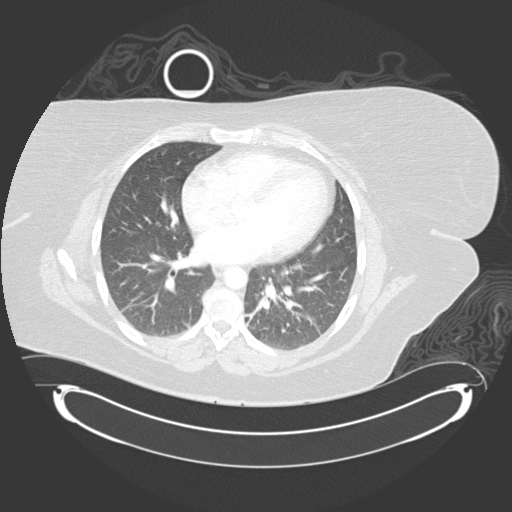
[im 78/163  soft-tissue]
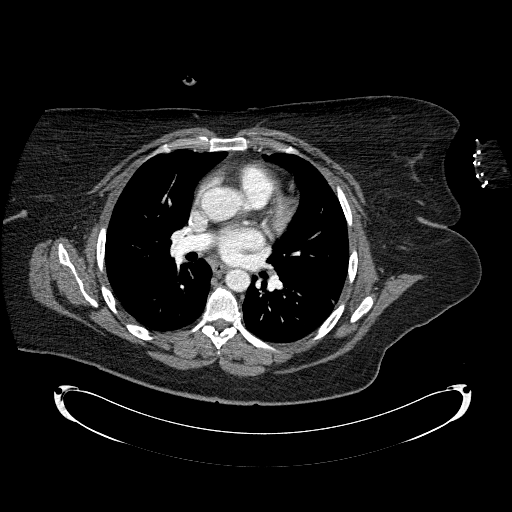
[im 85/163  lung]
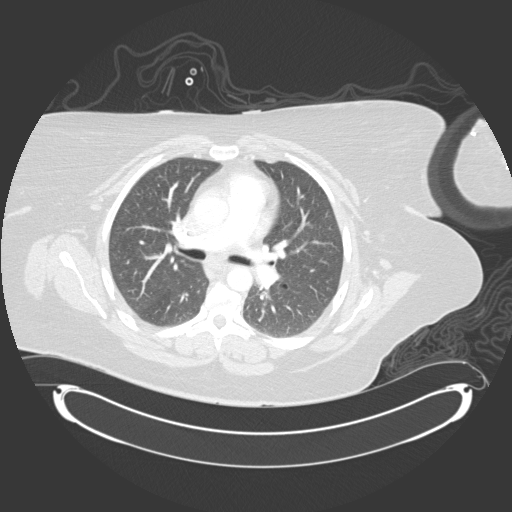
[im 99/163  soft-tissue]
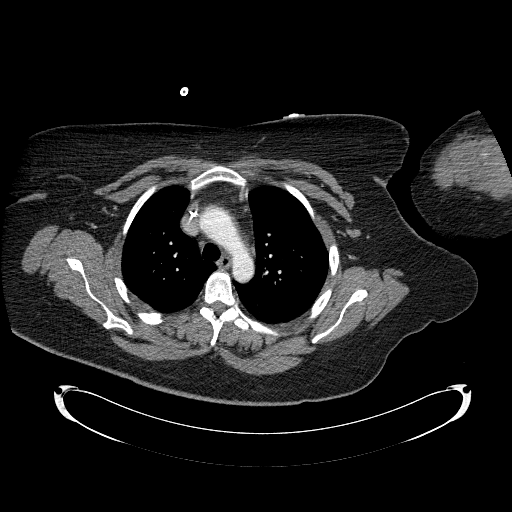
[im 106/163  lung]
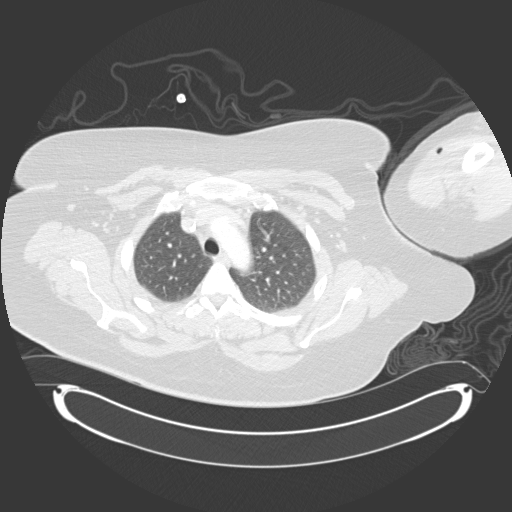
[im 113/163  soft-tissue]
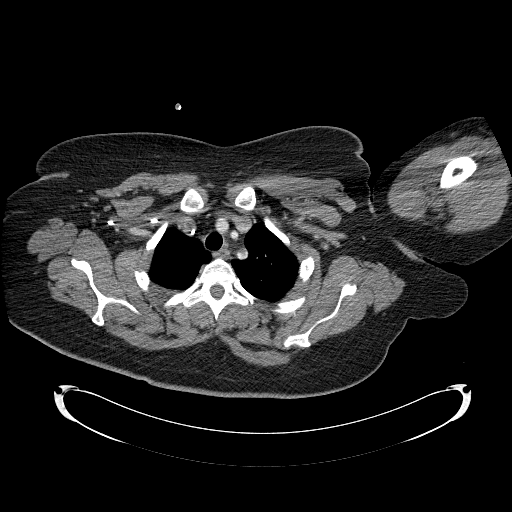
[im 127/163  lung]
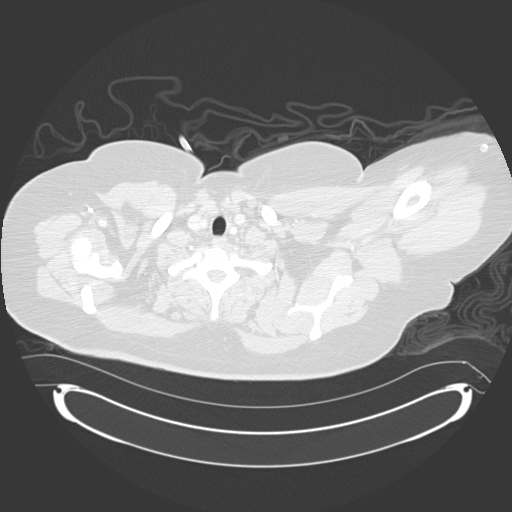
[im 134/163  soft-tissue]
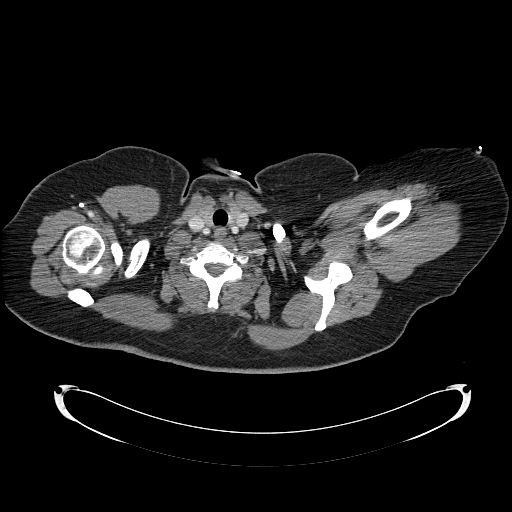
[im 141/163  lung]
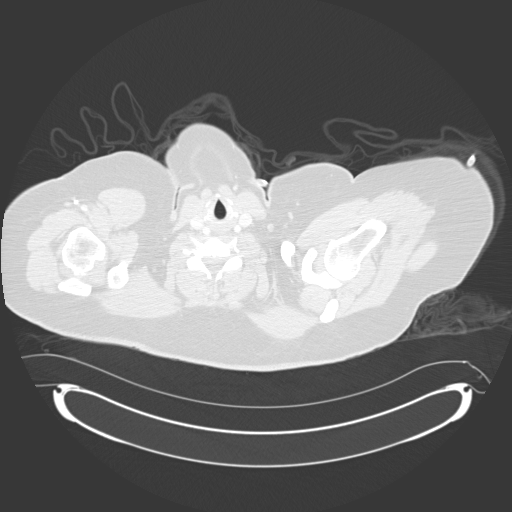
[im 155/163  soft-tissue]
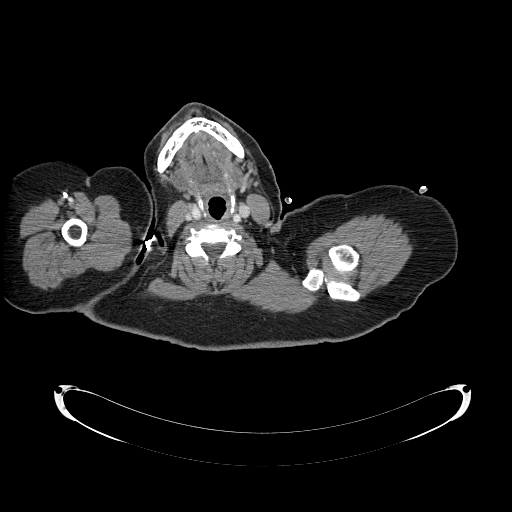

[Series 602: <mprcoronals · coronal · 0.98mm/px · 3 of 115 slices shown]
[im 29/115  soft-tissue]
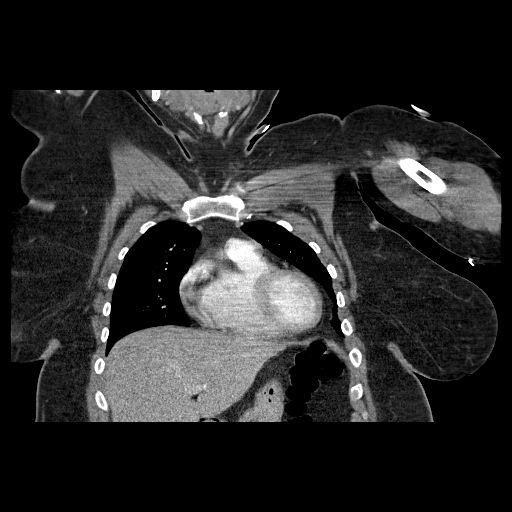
[im 58/115  soft-tissue]
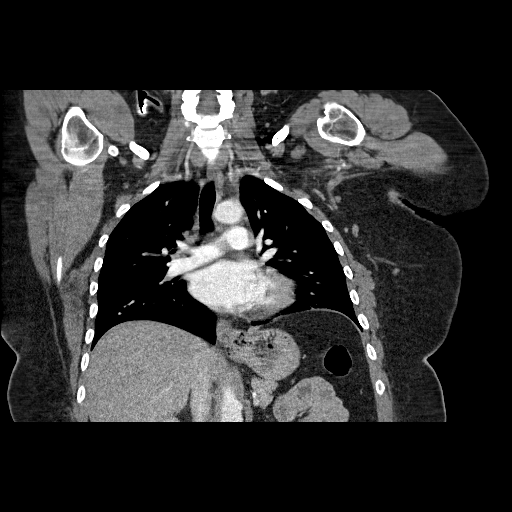
[im 86/115  soft-tissue]
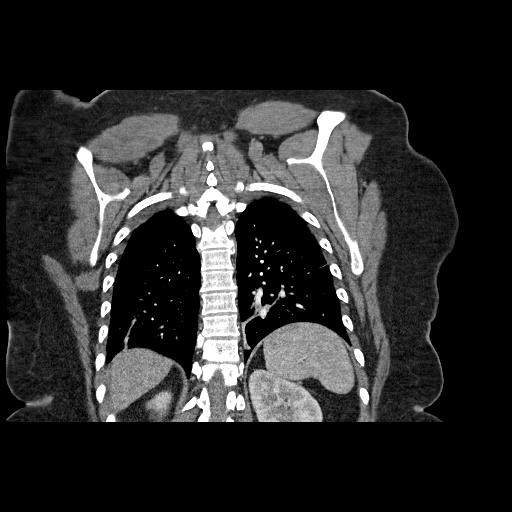

[19 of 46 positions shown; findings below may reference images not displayed]

FINDINGS: There is a filling defect within the left subclavian
artery from its origin extending superiorly to the takeoff of the
left vertebral artery.  The left vertebral artery is patent.  I see
no evidence of dissection in the aorta or great vessels.  This has
the appearance of embolus/thrombus.

Aorta is normal caliber.  Again, no dissection.  Heart is normal
size.

Linear atelectasis noted in the lung bases.  No effusions.

Imaging into the upper abdomen shows no acute findings.  Prior
cholecystectomy.

Review of the MIP images confirms the above findings.
IMPRESSION: Tubular filling defect within the proximal left subclavian artery
as seen on prior catheter angiogram most compatible with the
embolus or thrombus.  No visible dissection.

Linear atelectasis in the lung bases.

## 2013-11-11 DIAGNOSIS — Z0279 Encounter for issue of other medical certificate: Secondary | ICD-10-CM

## 2017-07-02 ENCOUNTER — Encounter: Payer: Self-pay | Admitting: Gastroenterology

## 2022-10-05 NOTE — Progress Notes (Signed)
[facility]  [address] [place],  [place]  [address] [phone]  Clinic Day:  10/06/2022  Referring physician: Galvin Proffer, MD   HISTORY OF PRESENT ILLNESS:  The patient is a 52 y.o. female  who I was asked to consult upon for iron deficiency anemia.  Recent labs showed a low hemoglobin of 9.9, with a low MCV of 82.  Iron studies done recently showed a low ferritin of 6, a low serum iron of 20, a TIBC of 401, and a low iron saturation of 5%.  According to the patient, she has had very heavy menstrual cycles for the past 7 years.  She claims each one lasts 7 days, with 3-4 of those days being extremely heavy.  It is very common for her to see thick clots throughout these days.  Of note, she claims to have not had a Pap smear/pelvic exam in 4 years.  She denies having other overt forms of blood loss to explain her iron deficiency anemia.  She had a colonoscopy 2 months ago for which 5 polyps were removed.  Her next colonoscopy is scheduled for 7 years.  She has a half sister with anemia, but does not know the etiology behind this.  Otherwise, there is no other family history of anemia or other hematologic disorders.  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     cetirizine (ZYRTEC) 10 MG chewable tablet Chew 10 mg by mouth daily.     cholecalciferol (VITAMIN D3) 25 MCG (1000 UNIT) tablet Take 1,000 Units by mouth daily.     fluconazole (DIFLUCAN) 50 MG tablet Take 50 mg by mouth daily.     lisinopril (ZESTRIL) 10 MG tablet Take 10 mg by mouth daily.     nystatin cream (MYCOSTATIN) Apply 1 Application topically 2 (two) times daily.     polyethylene glycol (MIRALAX / GLYCOLAX) 17 g packet Take 17 g by mouth daily.     polyethylene glycol (MIRALAX / GLYCOLAX) 17 g packet Take 17 g by mouth daily.     pregabalin (LYRICA) 25 MG capsule Take 25 mg by mouth 2 (two) times daily.     Prenatal Vit-Fe Fumarate-FA (MULTIVITAMIN-PRENATAL) 27-0.8 MG TABS tablet Take 1 tablet by mouth daily at 12 noon.     Rimegepant Sulfate (NURTEC) 75 MG TBDP Take by mouth.  ALPRAZolam (XANAX) 0.25 MG tablet Take 0.25 mg by mouth at bedtime as needed.       aspirin 81 MG tablet Take 81 mg by mouth daily.       esomeprazole (NEXIUM) 40 MG capsule Take 40 mg by mouth 2 (two) times daily.       levothyroxine (SYNTHROID, LEVOTHROID) 75 MCG tablet Take 75 mcg by mouth daily.       oxyCODONE-acetaminophen (PERCOCET) 10-325 MG per tablet Take 1 tablet by mouth every 6 (six) hours as needed.       simvastatin (ZOCOR) 40 MG tablet Take 40 mg by mouth at bedtime.       warfarin (COUMADIN) 5 MG tablet Take 5 mg by mouth daily.       No current facility-administered medications for this visit.    ALLERGIES:   Allergies  Allergen Reactions   Tetracyclines & Related    Terramycin Rash    FAMILY HISTORY:   Family History  Problem Relation Age of Onset   Hypertension Mother    Other Father        pace maker   Stroke Father    Deep vein thrombosis Father    Diabetes Father         Toe amputation   Heart disease Father        Heart Disease before age 53   Hyperlipidemia Father    Hypertension Father    Brain cancer Father    Arthritis Father    Renal Disease Father    Heart attack Father    Asthma Father    High Cholesterol Father    Diabetes Brother    Leukemia Maternal Grandmother    Lung disease Half-Sister    COPD Half-Sister     SOCIAL HISTORY:  The patient was born and raised in [place].  She currently lives in town with her husband of 29 years.  She has 4 children and 6 grandchildren.  She previously did retail and hosiery mill work.  She has smoked at least a half a pack of cigarettes daily for 28 years.  There is no history of alcohol abuse.  REVIEW OF SYSTEMS:  Review of Systems  Constitutional:  Negative for fatigue and fever.  HENT:   Negative for hearing loss and sore throat.   Eyes:  Negative for eye problems.  Respiratory:  Positive for shortness of breath. Negative for chest tightness, cough and hemoptysis.   Cardiovascular:  Negative for chest pain and palpitations.  Gastrointestinal:  Positive for constipation and nausea. Negative for abdominal distention, abdominal pain, blood in stool, diarrhea and vomiting.  Endocrine: Negative for hot flashes.  Genitourinary:  Negative for difficulty urinating, dysuria, frequency, hematuria and nocturia.   Musculoskeletal:  Positive for gait problem and myalgias. Negative for arthralgias and back pain.  Skin:  Positive for rash. Negative for itching.  Neurological:  Positive for gait problem. Negative for dizziness, extremity weakness, headaches, light-headedness and numbness.  Hematological: Negative.   Psychiatric/Behavioral: Negative.  Negative for depression and suicidal ideas. The patient is not nervous/anxious.     PHYSICAL EXAM:  Blood pressure (!) 164/77, pulse 86, temperature 98.5 F (36.9 C), resp. rate 16, height 5\' 4"  (1.626 m), weight 296 lb (134.3 kg), SpO2 96 %. Wt  Readings from Last 3 Encounters:  10/06/22 296 lb (134.3 kg)  07/12/11 243 lb 8 oz (110.5 kg)  01/04/11 236 lb (107 kg)   Body mass index is 50.81 kg/m. Performance status (ECOG): 1 -  Symptomatic but completely ambulatory Physical Exam Constitutional:      Appearance: Normal appearance. She is not ill-appearing.  HENT:     Mouth/Throat:     Mouth: Mucous membranes are moist.     Pharynx: Oropharynx is clear. No oropharyngeal exudate or posterior oropharyngeal erythema.  Cardiovascular:     Rate and Rhythm: Normal rate and regular rhythm.     Heart sounds: No murmur heard.    No friction rub. No gallop.  Pulmonary:     Effort: Pulmonary effort is normal. No respiratory distress.     Breath sounds: Normal breath sounds. No wheezing, rhonchi or rales.  Abdominal:     General: Bowel sounds are normal. There is no distension.     Palpations: Abdomen is soft. There is no mass.     Tenderness: There is no abdominal tenderness.  Musculoskeletal:        General: No swelling.     Right lower leg: No edema.     Left lower leg: No edema.     Comments: Left above-elbow amputation,  Lymphadenopathy:     Cervical: No cervical adenopathy.     Upper Body:     Right upper body: No supraclavicular or axillary adenopathy.     Left upper body: No supraclavicular or axillary adenopathy.     Lower Body: No right inguinal adenopathy. No left inguinal adenopathy.  Skin:    General: Skin is warm.     Coloration: Skin is not jaundiced.     Findings: No lesion or rash.  Neurological:     General: No focal deficit present.     Mental Status: She is alert and oriented to person, place, and time. Mental status is at baseline.  Psychiatric:        Mood and Affect: Mood normal.        Behavior: Behavior normal.        Thought Content: Thought content normal.   Visit LABS:       Latest Ref Rng & Units 10/06/2022   11:52 AM 08/08/2010    6:00 AM 08/07/2010    4:00 AM  CBC  WBC  9.6     13.2  15.7    Hemoglobin 12.0 - 16.0 9.4     9.3  9.9   Hematocrit 36 - 46 30     27.9  29.4   Platelets 150 - 400 K/uL 460     352  332      This result is from an external source.   ASSESSMENT & PLAN:  A 52 y.o. female who I was asked to consult upon for iron deficiency anemia.  I will arrange for her to receive IV iron over these next few weeks to rapidly replenish her iron stores and normalize her hemoglobin.  It clearly appears her iron deficiency anemia is related to her extremely heavy menstrual cycles.  I strongly encouraged her to see her gynecologist to undergo a formal, up-to-date Pap smear/pelvic exam to ensure there is no abnormal gynecologic pathology factoring into her heavy cycles.  Otherwise, I will see her back in 3 months to reassess her iron and hemoglobin levels to see how well she responded to her upcoming IV iron.  The patient understands all the plans discussed today and is in agreement with them.  I do appreciate Hague, Myrene Galas, MD for his new consult.   Briana Newman Kirby Funk, MD

## 2022-10-06 ENCOUNTER — Inpatient Hospital Stay: Payer: Medicaid Other

## 2022-10-06 ENCOUNTER — Inpatient Hospital Stay: Payer: Medicaid Other | Attending: Oncology | Admitting: Oncology

## 2022-10-06 ENCOUNTER — Other Ambulatory Visit: Payer: Self-pay | Admitting: Oncology

## 2022-10-06 ENCOUNTER — Encounter: Payer: Self-pay | Admitting: Oncology

## 2022-10-06 VITALS — BP 164/77 | HR 86 | Temp 98.5°F | Resp 16 | Ht 64.0 in | Wt 296.0 lb

## 2022-10-06 DIAGNOSIS — D500 Iron deficiency anemia secondary to blood loss (chronic): Secondary | ICD-10-CM

## 2022-10-06 DIAGNOSIS — Z832 Family history of diseases of the blood and blood-forming organs and certain disorders involving the immune mechanism: Secondary | ICD-10-CM

## 2022-10-06 DIAGNOSIS — N920 Excessive and frequent menstruation with regular cycle: Secondary | ICD-10-CM | POA: Insufficient documentation

## 2022-10-06 DIAGNOSIS — D509 Iron deficiency anemia, unspecified: Secondary | ICD-10-CM | POA: Diagnosis not present

## 2022-10-06 DIAGNOSIS — Z808 Family history of malignant neoplasm of other organs or systems: Secondary | ICD-10-CM

## 2022-10-06 DIAGNOSIS — F17210 Nicotine dependence, cigarettes, uncomplicated: Secondary | ICD-10-CM

## 2022-10-06 DIAGNOSIS — Z806 Family history of leukemia: Secondary | ICD-10-CM

## 2022-10-06 LAB — CBC AND DIFFERENTIAL
HCT: 30 — AB (ref 36–46)
Hemoglobin: 9.4 — AB (ref 12.0–16.0)
Neutrophils Absolute: 6.53
Platelets: 460 10*3/uL — AB (ref 150–400)
WBC: 9.6

## 2022-10-06 LAB — CBC: RBC: 3.72 — AB (ref 3.87–5.11)

## 2022-10-11 ENCOUNTER — Telehealth: Payer: Self-pay | Admitting: Oncology

## 2022-10-11 NOTE — Telephone Encounter (Signed)
Contacted pt to schedule appts. Unable to reach via phone.   Scheduling Message Entered by Rennis Harding A on 10/06/2022 at  2:12 PM Priority: Routine <No visit type provided>  Department: [facility]  Provider:  Scheduling Notes:  Pt needs iv iron next week  Labs/appt on 10-4-2

## 2022-10-12 ENCOUNTER — Telehealth: Payer: Self-pay | Admitting: Oncology

## 2022-10-12 ENCOUNTER — Other Ambulatory Visit: Payer: Self-pay

## 2022-10-12 DIAGNOSIS — N920 Excessive and frequent menstruation with regular cycle: Secondary | ICD-10-CM | POA: Diagnosis present

## 2022-10-12 DIAGNOSIS — D509 Iron deficiency anemia, unspecified: Secondary | ICD-10-CM | POA: Diagnosis present

## 2022-10-12 DIAGNOSIS — D500 Iron deficiency anemia secondary to blood loss (chronic): Secondary | ICD-10-CM

## 2022-10-12 LAB — FERRITIN: Ferritin: 5 ng/mL — ABNORMAL LOW (ref 11–307)

## 2022-10-12 LAB — IRON AND TIBC
Iron: 26 ug/dL — ABNORMAL LOW (ref 28–170)
Saturation Ratios: 6 % — ABNORMAL LOW (ref 10.4–31.8)
TIBC: 449 ug/dL (ref 250–450)
UIBC: 423 ug/dL

## 2022-10-12 MED FILL — Ferumoxytol Inj 510 MG/17ML (30 MG/ML) (Elemental Fe): INTRAVENOUS | Qty: 17 | Status: AC

## 2022-10-12 NOTE — Telephone Encounter (Signed)
10/12/22 Next appt scheduled and confirmed with patient

## 2022-10-13 ENCOUNTER — Ambulatory Visit: Payer: PRIVATE HEALTH INSURANCE

## 2022-10-13 ENCOUNTER — Inpatient Hospital Stay: Payer: Medicaid Other

## 2022-10-13 VITALS — BP 138/77 | HR 77 | Temp 98.5°F | Resp 18 | Ht 64.0 in | Wt 294.1 lb

## 2022-10-13 DIAGNOSIS — D500 Iron deficiency anemia secondary to blood loss (chronic): Secondary | ICD-10-CM

## 2022-10-13 DIAGNOSIS — D509 Iron deficiency anemia, unspecified: Secondary | ICD-10-CM | POA: Diagnosis not present

## 2022-10-13 MED ORDER — SODIUM CHLORIDE 0.9 % IV SOLN
510.0000 mg | Freq: Once | INTRAVENOUS | Status: AC
  Administered 2022-10-13: 510 mg via INTRAVENOUS
  Filled 2022-10-13: qty 510

## 2022-10-13 MED ORDER — SODIUM CHLORIDE 0.9 % IV SOLN: Freq: Once | INTRAVENOUS | Status: AC

## 2022-10-13 NOTE — Patient Instructions (Signed)

## 2022-10-16 MED FILL — Ferumoxytol Inj 510 MG/17ML (30 MG/ML) (Elemental Fe): INTRAVENOUS | Qty: 17 | Status: AC

## 2022-10-17 ENCOUNTER — Inpatient Hospital Stay: Payer: Medicaid Other

## 2022-10-17 VITALS — BP 106/58 | HR 77 | Temp 97.9°F | Resp 18

## 2022-10-17 DIAGNOSIS — D509 Iron deficiency anemia, unspecified: Secondary | ICD-10-CM | POA: Diagnosis not present

## 2022-10-17 DIAGNOSIS — D500 Iron deficiency anemia secondary to blood loss (chronic): Secondary | ICD-10-CM

## 2022-10-17 MED ORDER — SODIUM CHLORIDE 0.9 % IV SOLN: Freq: Once | INTRAVENOUS | Status: AC

## 2022-10-17 MED ORDER — SODIUM CHLORIDE 0.9 % IV SOLN
510.0000 mg | Freq: Once | INTRAVENOUS | Status: AC
  Administered 2022-10-17: 510 mg via INTRAVENOUS
  Filled 2022-10-17: qty 510

## 2022-10-17 NOTE — Patient Instructions (Signed)
Ferumoxytol Injection What is this medication? FERUMOXYTOL (FER ue MOX i tol) treats low levels of iron in your body (iron deficiency anemia). Iron is a mineral that plays an important role in making red blood cells, which carry oxygen from your lungs to the rest of your body. This medicine may be used for other purposes; ask your health care provider or pharmacist if you have questions. COMMON BRAND NAME(S): Feraheme What should I tell my care team before I take this medication? They need to know if you have any of these conditions: Anemia not caused by low iron levels High levels of iron in the blood Magnetic resonance imaging (MRI) test scheduled An unusual or allergic reaction to iron, other medications, foods, dyes, or preservatives Pregnant or trying to get pregnant Breastfeeding How should I use this medication? This medication is injected into a vein. It is given by your care team in a hospital or clinic setting. Talk to your care team the use of this medication in children. Special care may be needed. Overdosage: If you think you have taken too much of this medicine contact a poison control center or emergency room at once. NOTE: This medicine is only for you. Do not share this medicine with others. What if I miss a dose? It is important not to miss your dose. Call your care team if you are unable to keep an appointment. What may interact with this medication? Other iron products This list may not describe all possible interactions. Give your health care provider a list of all the medicines, herbs, non-prescription drugs, or dietary supplements you use. Also tell them if you smoke, drink alcohol, or use illegal drugs. Some items may interact with your medicine. What should I watch for while using this medication? Visit your care team regularly. Tell your care team if your symptoms do not start to get better or if they get worse. You may need blood work done while you are taking this  medication. You may need to follow a special diet. Talk to your care team. Foods that contain iron include: whole grains/cereals, dried fruits, beans, or peas, leafy green vegetables, and organ meats (liver, kidney). What side effects may I notice from receiving this medication? Side effects that you should report to your care team as soon as possible: Allergic reactions--skin rash, itching, hives, swelling of the face, lips, tongue, or throat Low blood pressure--dizziness, feeling faint or lightheaded, blurry vision Shortness of breath Side effects that usually do not require medical attention (report to your care team if they continue or are bothersome): Flushing Headache Joint pain Muscle pain Nausea Pain, redness, or irritation at injection site This list may not describe all possible side effects. Call your doctor for medical advice about side effects. You may report side effects to FDA at 1-800-FDA-1088. Where should I keep my medication? This medication is given in a hospital or clinic and will not be stored at home. NOTE: This sheet is a summary. It may not cover all possible information. If you have questions about this medicine, talk to your doctor, pharmacist, or health care provider.  2024 Elsevier/Gold Standard (2021-09-26 00:00:00)  

## 2023-01-08 NOTE — Progress Notes (Deleted)
[facility]  [address] [place],  [place]  [address] [phone]  Clinic Day:  01/08/2023  Referring physician: Galvin Proffer, MD   HISTORY OF PRESENT ILLNESS:  The patient is a 52 y.o. female  who I was asked to consult upon for iron deficiency anemia secondary to heavy menstrual cycles.  She comes in today to reassess her iron and hemoglobin levels after recently receiving IV iron.  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     ALPRAZolam (XANAX) 0.25 MG tablet Take 0.25 mg by mouth at bedtime as needed.       aspirin 81 MG tablet Take 81 mg by mouth daily.       cetirizine (ZYRTEC) 10 MG chewable tablet Chew 10 mg by mouth daily.     cholecalciferol (VITAMIN D3) 25 MCG (1000 UNIT) tablet Take 1,000 Units by mouth daily.     esomeprazole (NEXIUM) 40 MG capsule Take 40 mg by mouth 2 (two) times daily.       fluconazole (DIFLUCAN) 50 MG tablet Take 50 mg by mouth daily.     hydrochlorothiazide (HYDRODIURIL) 12.5 MG tablet Take 12.5 mg by mouth daily.      levothyroxine (SYNTHROID, LEVOTHROID) 75 MCG tablet Take 75 mcg by mouth daily.       lisinopril (ZESTRIL) 10 MG tablet Take 10 mg by mouth daily.     nystatin cream (MYCOSTATIN) Apply 1 Application topically 2 (two) times daily.     oxyCODONE-acetaminophen (PERCOCET) 10-325 MG per tablet Take 1 tablet by mouth every 6 (six) hours as needed.       polyethylene glycol (MIRALAX / GLYCOLAX) 17 g packet Take 17 g by mouth daily.     polyethylene glycol (MIRALAX / GLYCOLAX) 17 g packet Take 17 g by mouth daily.     pregabalin (LYRICA) 25 MG capsule Take 25 mg by mouth 2 (two) times daily.     Prenatal Vit-Fe Fumarate-FA (MULTIVITAMIN-PRENATAL) 27-0.8 MG TABS tablet Take 1 tablet by mouth daily at 12 noon.     Rimegepant Sulfate (NURTEC) 75 MG TBDP Take by mouth.     simvastatin (ZOCOR) 40 MG tablet Take 40 mg by mouth at bedtime.       sulfamethoxazole-trimethoprim (BACTRIM DS) 800-160 MG tablet Take 1 tablet by mouth 2 (two) times daily.     warfarin (COUMADIN) 5 MG  tablet Take 5 mg by mouth daily.       No current facility-administered medications for this visit.    ALLERGIES:   Allergies  Allergen Reactions   Tetracyclines & Related    Terramycin Rash    FAMILY HISTORY:   Family History  Problem Relation Age of Onset   Hypertension Mother    Other Father        pace maker   Stroke Father    Deep vein thrombosis Father    Diabetes Father        Toe amputation   Heart disease Father        Heart Disease before age 67   Hyperlipidemia Father    Hypertension Father    Brain cancer Father    Arthritis Father    Renal Disease Father    Heart attack Father    Asthma Father    High Cholesterol Father    Diabetes Brother    Leukemia Maternal Grandmother    Lung disease Half-Sister    COPD Half-Sister     SOCIAL HISTORY:  The patient was born and raised in [place].  She currently lives in town with her husband of 29 years.  She has 4 children and 6 grandchildren.   She previously did retail and hosiery mill work.  She has smoked at least a half a pack of cigarettes daily for 28 years.  There is no history of alcohol abuse.  REVIEW OF SYSTEMS:  Review of Systems  Constitutional:  Negative for fatigue and fever.  HENT:   Negative for hearing loss and sore throat.   Eyes:  Negative for eye problems.  Respiratory:  Positive for shortness of breath. Negative for chest tightness, cough and hemoptysis.   Cardiovascular:  Negative for chest pain and palpitations.  Gastrointestinal:  Positive for constipation and nausea. Negative for abdominal distention, abdominal pain, blood in stool, diarrhea and vomiting.  Endocrine: Negative for hot flashes.  Genitourinary:  Negative for difficulty urinating, dysuria, frequency, hematuria and nocturia.   Musculoskeletal:  Positive for gait problem and myalgias. Negative for arthralgias and back pain.  Skin:  Positive for rash. Negative for itching.  Neurological:  Positive for gait problem. Negative for dizziness, extremity weakness, headaches, light-headedness and numbness.  Hematological: Negative.   Psychiatric/Behavioral: Negative.  Negative for depression and suicidal ideas. The patient is not nervous/anxious.     PHYSICAL EXAM:  There were no vitals taken for this visit. Wt Readings from Last 3 Encounters:  10/13/22 294 lb 1.9 oz (133.4 kg)  10/06/22 296 lb (134.3 kg)  07/12/11 243 lb 8 oz (110.5 kg)   There is no height or weight on file to calculate BMI. Performance status (ECOG): 1 - Symptomatic but completely ambulatory Physical Exam Constitutional:      Appearance: Normal appearance. She is not ill-appearing.  HENT:     Mouth/Throat:     Mouth: Mucous membranes are moist.     Pharynx: Oropharynx is clear. No oropharyngeal exudate or posterior oropharyngeal erythema.  Cardiovascular:     Rate and Rhythm: Normal rate and regular rhythm.     Heart sounds: No murmur heard.    No friction rub. No gallop.   Pulmonary:     Effort: Pulmonary effort is normal. No respiratory distress.     Breath sounds: Normal breath sounds. No wheezing, rhonchi or rales.  Abdominal:     General: Bowel sounds are normal. There is no distension.     Palpations: Abdomen  is soft. There is no mass.     Tenderness: There is no abdominal tenderness.  Musculoskeletal:        General: No swelling.     Right lower leg: No edema.     Left lower leg: No edema.     Comments: Left above-elbow amputation,  Lymphadenopathy:     Cervical: No cervical adenopathy.     Upper Body:     Right upper body: No supraclavicular or axillary adenopathy.     Left upper body: No supraclavicular or axillary adenopathy.     Lower Body: No right inguinal adenopathy. No left inguinal adenopathy.  Skin:    General: Skin is warm.     Coloration: Skin is not jaundiced.     Findings: No lesion or rash.  Neurological:     General: No focal deficit present.     Mental Status: She is alert and oriented to person, place, and time. Mental status is at baseline.  Psychiatric:        Mood and Affect: Mood normal.        Behavior: Behavior normal.        Thought Content: Thought content normal.   Visit LABS:       Latest Ref Rng & Units 10/06/2022   11:52 AM 08/08/2010    6:00 AM 08/07/2010    4:00 AM  CBC  WBC  9.6     13.2  15.7   Hemoglobin 12.0 - 16.0 9.4     9.3  9.9   Hematocrit 36 - 46 30     27.9  29.4   Platelets 150 - 400 K/uL 460     352  332      This result is from an external source.   ASSESSMENT & PLAN:  A 52 y.o. female who I was asked to consult upon for iron deficiency anemia.  I will arrange for her to receive IV iron over these next few weeks to rapidly replenish her iron stores and normalize her hemoglobin.  It clearly appears her iron deficiency anemia is related to her extremely heavy menstrual cycles.  I strongly encouraged her to see her gynecologist to undergo a formal, up-to-date Pap smear/pelvic exam to ensure  there is no abnormal gynecologic pathology factoring into her heavy cycles.  Otherwise, I will see her back in 3 months to reassess her iron and hemoglobin levels to see how well she responded to her upcoming IV iron.  The patient understands all the plans discussed today and is in agreement with them.  I do appreciate Hague, Myrene Galas, MD for his new consult.   Cici Rodriges Kirby Funk, MD

## 2023-01-09 ENCOUNTER — Inpatient Hospital Stay: Payer: Medicaid Other

## 2023-01-09 ENCOUNTER — Inpatient Hospital Stay: Payer: Medicaid Other | Attending: Oncology | Admitting: Oncology

## 2023-01-24 NOTE — Progress Notes (Signed)
[facility]  [address] [place],  [place]  [address] [phone]  Clinic Day:  01/25/2023  Referring physician: Galvin Proffer, MD   HISTORY OF PRESENT ILLNESS:  The patient is a 52 y.o. female  who I recently began seeing for iron deficiency anemia secondary to heavy menstrual cycles.  She comes in today to reassess her iron and hemoglobin levels after recently receiving IV iron in July 2024. She has felt better since receiving her IV iron.  Other than her menstrual cycles, she denies having other overt forms of blood loss.     PHYSICAL EXAM:  Blood pressure (!) 180/80, pulse 90, temperature 98.9 F (37.2 C), resp. rate 16, height 5\' 4"  (1.626 m), weight 298 lb 4.8 oz (135.3 kg), SpO2 95%. Wt Readings from Last 3 Encounters:  01/25/23 298 lb 4.8 oz (135.3 kg)  10/13/22 294 lb 1.9 oz (133.4 kg)  10/06/22 296 lb (134.3 kg)   Body mass index is 51.2 kg/m. Performance status (ECOG): 1 - Symptomatic but completely ambulatory Physical Exam Constitutional:      Appearance: Normal appearance. She is not ill-appearing.  HENT:     Mouth/Throat:     Mouth: Mucous membranes are moist.     Pharynx: Oropharynx is clear. No oropharyngeal exudate or posterior oropharyngeal erythema.  Cardiovascular:     Rate and Rhythm: Normal rate and regular rhythm.     Heart sounds: No murmur heard.    No friction rub. No gallop.  Pulmonary:     Effort: Pulmonary effort is normal. No respiratory distress.     Breath sounds: Normal breath sounds. No wheezing, rhonchi or rales.  Abdominal:     General: Bowel sounds are normal. There is no distension.     Palpations: Abdomen is soft. There is no mass.     Tenderness: There is no abdominal tenderness.  Musculoskeletal:        General: No swelling.     Right lower leg: No edema.     Left lower leg: No edema.     Comments: Left above-elbow amputation,  Lymphadenopathy:     Cervical: No cervical adenopathy.      Upper Body:     Right upper body: No supraclavicular or axillary adenopathy.     Left upper body: No supraclavicular or axillary adenopathy.     Lower Body: No right inguinal adenopathy. No left inguinal adenopathy.  Skin:    General: Skin is warm.     Coloration: Skin is not jaundiced.     Findings: No lesion or rash.  Neurological:     General: No focal deficit present.     Mental Status: She is alert and oriented to person, place, and time. Mental status is at baseline.  Psychiatric:        Mood and Affect: Mood normal.        Behavior: Behavior normal.        Thought Content: Thought content normal.    LABS:      Latest Ref Rng & Units 01/25/2023   12:00 AM 10/06/2022   11:52 AM 08/08/2010    6:00 AM  CBC  WBC  10.3     9.6     13.2   Hemoglobin 12.0 - 16.0 12.2     9.4     9.3   Hematocrit 36 - 46 36     30     27.9   Platelets 150 - 400 K/uL 406  460     352      This result is from an external source.    ASSESSMENT & PLAN:  A 52 y.o. female who I was asked to consult upon for iron deficiency anemia.  I am pleased as her hemoglobin has significantly improved since receiving her IV iron.  Clinically, she appears to be doing well.  I will see her back in 4 months for repeat clinical assessment.  The patient understands all the plans discussed today and is in agreement with them.  Sarra Rachels Kirby Funk, MD

## 2023-01-25 ENCOUNTER — Inpatient Hospital Stay (HOSPITAL_BASED_OUTPATIENT_CLINIC_OR_DEPARTMENT_OTHER): Payer: PRIVATE HEALTH INSURANCE | Admitting: Oncology

## 2023-01-25 ENCOUNTER — Other Ambulatory Visit: Payer: Self-pay | Admitting: Oncology

## 2023-01-25 ENCOUNTER — Inpatient Hospital Stay: Payer: PRIVATE HEALTH INSURANCE | Attending: Oncology

## 2023-01-25 VITALS — BP 180/80 | HR 90 | Temp 98.9°F | Resp 16 | Ht 64.0 in | Wt 298.3 lb

## 2023-01-25 DIAGNOSIS — N920 Excessive and frequent menstruation with regular cycle: Secondary | ICD-10-CM | POA: Diagnosis present

## 2023-01-25 DIAGNOSIS — D500 Iron deficiency anemia secondary to blood loss (chronic): Secondary | ICD-10-CM | POA: Diagnosis present

## 2023-01-25 LAB — CBC AND DIFFERENTIAL
HCT: 36 (ref 36–46)
Hemoglobin: 12.2 (ref 12.0–16.0)
Neutrophils Absolute: 6.39
Platelets: 406 10*3/uL — AB (ref 150–400)
WBC: 10.3

## 2023-01-25 LAB — IRON AND TIBC
Iron: 37 ug/dL (ref 28–170)
Saturation Ratios: 9 % — ABNORMAL LOW (ref 10.4–31.8)
TIBC: 427 ug/dL (ref 250–450)
UIBC: 390 ug/dL

## 2023-01-25 LAB — CBC: RBC: 3.82 — AB (ref 3.87–5.11)

## 2023-01-25 LAB — FERRITIN: Ferritin: 5 ng/mL — ABNORMAL LOW (ref 11–307)

## 2023-01-25 MED ORDER — AZITHROMYCIN 250 MG PO TABS: ORAL_TABLET | ORAL | 0 refills | Status: DC

## 2023-03-13 ENCOUNTER — Encounter: Payer: Self-pay | Admitting: Oncology

## 2023-03-14 ENCOUNTER — Encounter: Payer: Self-pay | Admitting: Oncology

## 2023-05-25 ENCOUNTER — Other Ambulatory Visit: Payer: Self-pay

## 2023-05-25 DIAGNOSIS — D500 Iron deficiency anemia secondary to blood loss (chronic): Secondary | ICD-10-CM

## 2023-05-27 ENCOUNTER — Other Ambulatory Visit: Payer: Self-pay | Admitting: Oncology

## 2023-05-27 DIAGNOSIS — D500 Iron deficiency anemia secondary to blood loss (chronic): Secondary | ICD-10-CM

## 2023-05-27 NOTE — Progress Notes (Deleted)
 [facility]  [address] [place],  [place]  [address] [phone]  Clinic Day:  01/25/2023  Referring physician: Galvin Proffer, MD   HISTORY OF PRESENT ILLNESS:  The patient is a 53 y.o. female  who I recently began seeing for iron deficiency anemia secondary to heavy menstrual cycles.  She comes in today to reassess her iron and hemoglobin levels after recently receiving IV iron in July 2024. She has felt better since receiving her IV iron.  Other than her menstrual cycles, she denies having other overt forms of blood loss.     PHYSICAL EXAM:  There were no vitals taken for this visit. Wt Readings from Last 3 Encounters:  01/25/23 298 lb 4.8 oz (135.3 kg)  10/13/22 294 lb 1.9 oz (133.4 kg)  10/06/22 296 lb (134.3 kg)   There is no height or weight on file to calculate BMI. Performance status (ECOG): 1 - Symptomatic but completely ambulatory Physical Exam Constitutional:      Appearance: Normal appearance. She is not ill-appearing.  HENT:     Mouth/Throat:     Mouth: Mucous membranes are moist.     Pharynx: Oropharynx is clear. No oropharyngeal exudate or posterior oropharyngeal erythema.  Cardiovascular:     Rate and Rhythm: Normal rate and regular rhythm.     Heart sounds: No murmur heard.    No friction rub. No gallop.  Pulmonary:     Effort: Pulmonary effort is normal. No respiratory distress.     Breath sounds: Normal breath sounds. No wheezing, rhonchi or rales.  Abdominal:     General: Bowel sounds are normal. There is no distension.     Palpations: Abdomen is soft. There is no mass.     Tenderness: There is no abdominal tenderness.  Musculoskeletal:        General: No swelling.     Right lower leg: No edema.     Left lower leg: No edema.     Comments: Left above-elbow amputation,  Lymphadenopathy:     Cervical: No cervical adenopathy.     Upper Body:     Right upper body: No supraclavicular or axillary adenopathy.      Left upper body: No supraclavicular or axillary adenopathy.     Lower Body: No right inguinal adenopathy. No left inguinal adenopathy.  Skin:    General: Skin is warm.     Coloration: Skin is not jaundiced.     Findings: No lesion or rash.  Neurological:     General: No focal deficit present.     Mental Status: She is alert and oriented to person, place, and time. Mental status is at baseline.  Psychiatric:        Mood and Affect: Mood normal.        Behavior: Behavior normal.        Thought Content: Thought content normal.    LABS:      Latest Ref Rng & Units 01/25/2023   12:00 AM 10/06/2022   11:52 AM 08/08/2010    6:00 AM  CBC  WBC  10.3     9.6     13.2   Hemoglobin 12.0 - 16.0 12.2     9.4     9.3   Hematocrit 36 - 46 36     30     27.9   Platelets 150 - 400 K/uL 406     460     352  This result is from an external source.    ASSESSMENT & PLAN:  A 53 y.o. female who I was asked to consult upon for iron deficiency anemia.  I am pleased as her hemoglobin has significantly improved since receiving her IV iron.  Clinically, she appears to be doing well.  I will see her back in 4 months for repeat clinical assessment.  The patient understands all the plans discussed today and is in agreement with them.  Mirabella Hilario Kirby Funk, MD

## 2023-05-28 ENCOUNTER — Inpatient Hospital Stay: Payer: PRIVATE HEALTH INSURANCE | Admitting: Oncology

## 2023-05-28 ENCOUNTER — Inpatient Hospital Stay: Payer: PRIVATE HEALTH INSURANCE

## 2023-06-07 ENCOUNTER — Other Ambulatory Visit: Payer: Self-pay | Admitting: Oncology

## 2023-06-07 DIAGNOSIS — D500 Iron deficiency anemia secondary to blood loss (chronic): Secondary | ICD-10-CM

## 2023-06-07 NOTE — Progress Notes (Signed)
 [facility]  [address] [place],  [place]  [address] [phone]  Clinic Day:  06/08/2023  Referring physician: Galvin Proffer, MD   HISTORY OF PRESENT ILLNESS:  The patient is a 53 y.o. female  who I recently began seeing for iron deficiency anemia secondary to heavy menstrual cycles.  She comes in today to reassess her iron and hemoglobin levels.  She last received IV iron in July 2024. She comes in today for routine follow-up.  Since her last visit, the patient has felt weaker.  She denies her cycles have been particularly.  She denies having other overt forms of blood loss.  Of note, recent labs at her primary care office showed a drop in her hemoglobin since her last visit.   PHYSICAL EXAM:  Blood pressure 115/65, pulse 87, temperature 98.9 F (37.2 C), temperature source Oral, resp. rate 18, height 5\' 4"  (1.626 m), weight (!) 302 lb (137 kg), SpO2 98%. Wt Readings from Last 3 Encounters:  06/08/23 (!) 302 lb (137 kg)  01/25/23 298 lb 4.8 oz (135.3 kg)  10/13/22 294 lb 1.9 oz (133.4 kg)   Body mass index is 51.84 kg/m. Performance status (ECOG): 1 - Symptomatic but completely ambulatory Physical Exam Constitutional:      Appearance: Normal appearance. She is not ill-appearing.  HENT:     Mouth/Throat:     Mouth: Mucous membranes are moist.     Pharynx: Oropharynx is clear. No oropharyngeal exudate or posterior oropharyngeal erythema.  Cardiovascular:     Rate and Rhythm: Normal rate and regular rhythm.     Heart sounds: No murmur heard.    No friction rub. No gallop.  Pulmonary:     Effort: Pulmonary effort is normal. No respiratory distress.     Breath sounds: Normal breath sounds. No wheezing, rhonchi or rales.  Abdominal:     General: Bowel sounds are normal. There is no distension.     Palpations: Abdomen is soft. There is no mass.     Tenderness: There is no abdominal tenderness.  Musculoskeletal:        General: No swelling.      Right lower leg: No edema.     Left lower leg: No edema.     Comments: Left above-elbow amputation,  Lymphadenopathy:     Cervical: No cervical adenopathy.     Upper Body:     Right upper body: No supraclavicular or axillary adenopathy.     Left upper body: No supraclavicular or axillary adenopathy.     Lower Body: No right inguinal adenopathy. No left inguinal adenopathy.  Skin:    General: Skin is warm.     Coloration: Skin is not jaundiced.     Findings: No lesion or rash.  Neurological:     General: No focal deficit present.     Mental Status: She is alert and oriented to person, place, and time. Mental status is at baseline.  Psychiatric:        Mood and Affect: Mood normal.        Behavior: Behavior normal.        Thought Content: Thought content normal.    LABS:      Latest Ref Rng & Units 01/25/2023   12:00 AM 10/06/2022   11:52 AM 08/08/2010    6:00 AM  CBC  WBC  10.3     9.6     13.2   Hemoglobin 12.0 - 16.0 12.2  9.4     9.3   Hematocrit 36 - 46 36     30     27.9   Platelets 150 - 400 K/uL 406     460     352      This result is from an external source.    Latest Reference Range & Units 06/08/23 13:56  Iron 28 - 170 ug/dL 38  UIBC ug/dL 161  TIBC 096 - 045 ug/dL 409 (H)  Saturation Ratios 10.4 - 31.8 % 8 (L)  Ferritin 11 - 307 ng/mL 5 (L)  (H): Data is abnormally high (L): Data is abnormally low ASSESSMENT & PLAN:  A 53 y.o. female with iron deficiency anemia.  Based upon her recent labs, her iron deficiency anemia has returned.  Based upon this, I will arrange for her to receive IV iron over these next few weeks to replenish her iron stores and improve her hemoglobin.  I will see her back in 4 months for repeat clinical assessment.  The patient understands all the plans discussed today and is in agreement with them.  Rashaud Ybarbo Kirby Funk, MD

## 2023-06-08 ENCOUNTER — Inpatient Hospital Stay: Payer: 59 | Attending: Oncology

## 2023-06-08 ENCOUNTER — Other Ambulatory Visit: Payer: Self-pay | Admitting: Oncology

## 2023-06-08 ENCOUNTER — Encounter: Payer: Self-pay | Admitting: Oncology

## 2023-06-08 ENCOUNTER — Inpatient Hospital Stay (HOSPITAL_BASED_OUTPATIENT_CLINIC_OR_DEPARTMENT_OTHER): Payer: 59 | Admitting: Oncology

## 2023-06-08 VITALS — BP 115/65 | HR 87 | Temp 98.9°F | Resp 18 | Ht 64.0 in | Wt 302.0 lb

## 2023-06-08 DIAGNOSIS — D500 Iron deficiency anemia secondary to blood loss (chronic): Secondary | ICD-10-CM

## 2023-06-08 DIAGNOSIS — N920 Excessive and frequent menstruation with regular cycle: Secondary | ICD-10-CM | POA: Diagnosis present

## 2023-06-08 LAB — IRON AND TIBC
Iron: 38 ug/dL (ref 28–170)
Saturation Ratios: 8 % — ABNORMAL LOW (ref 10.4–31.8)
TIBC: 484 ug/dL — ABNORMAL HIGH (ref 250–450)
UIBC: 446 ug/dL

## 2023-06-08 LAB — FERRITIN: Ferritin: 5 ng/mL — ABNORMAL LOW (ref 11–307)

## 2023-06-09 ENCOUNTER — Encounter: Payer: Self-pay | Admitting: Oncology

## 2023-06-11 ENCOUNTER — Encounter: Payer: Self-pay | Admitting: Oncology

## 2023-06-11 ENCOUNTER — Telehealth: Payer: Self-pay

## 2023-06-11 NOTE — Telephone Encounter (Signed)
 Dr Melvyn Neth: Let pt know she is iron deficient again. Please schedule her for repeat IV iron ASAP. thx

## 2023-06-14 ENCOUNTER — Inpatient Hospital Stay

## 2023-06-14 VITALS — BP 115/66 | HR 72 | Temp 98.0°F | Resp 18

## 2023-06-14 DIAGNOSIS — D500 Iron deficiency anemia secondary to blood loss (chronic): Secondary | ICD-10-CM | POA: Diagnosis not present

## 2023-06-14 MED ORDER — FERUMOXYTOL INJECTION 510 MG/17 ML
510.0000 mg | Freq: Once | INTRAVENOUS | Status: AC
  Administered 2023-06-14: 510 mg via INTRAVENOUS
  Filled 2023-06-14: qty 510

## 2023-06-14 MED ORDER — SODIUM CHLORIDE 0.9 % IV SOLN: INTRAVENOUS | Status: DC

## 2023-06-14 NOTE — Patient Instructions (Signed)

## 2023-06-21 ENCOUNTER — Inpatient Hospital Stay

## 2023-06-21 VITALS — BP 120/66 | HR 78 | Temp 97.9°F | Resp 18

## 2023-06-21 DIAGNOSIS — D500 Iron deficiency anemia secondary to blood loss (chronic): Secondary | ICD-10-CM | POA: Diagnosis not present

## 2023-06-21 MED ORDER — FERUMOXYTOL INJECTION 510 MG/17 ML
510.0000 mg | Freq: Once | INTRAVENOUS | Status: AC
  Administered 2023-06-21: 510 mg via INTRAVENOUS
  Filled 2023-06-21: qty 510

## 2023-06-21 MED ORDER — SODIUM CHLORIDE 0.9 % IV SOLN: INTRAVENOUS | Status: DC

## 2023-06-21 NOTE — Patient Instructions (Signed)

## 2023-10-08 NOTE — Progress Notes (Unsigned)
 [facility]  [address] [place],  [place]  [address] [phone]  Clinic Day:  10/09/2023  Referring physician: Pia Kerney SQUIBB, MD   HISTORY OF PRESENT ILLNESS:  The patient is a 53 y.o. female  who I recently began seeing for iron deficiency anemia secondary to heavy menstrual cycles.  She last received IV iron in March 2025.  She comes in today to reassess her iron and hemoglobin levels after receiving another course of IV iron.  Although she feels somewhat better, she has been on weaker overall this past few weeks.  Her cycles have maintained somewhat heavy.  She denies having other overt forms of blood loss since her last visit.     PHYSICAL EXAM:  Blood pressure (!) 146/75, pulse 77, temperature 98.8 F (37.1 C), temperature source Oral, resp. rate 16, height 5' 4 (1.626 m), weight (!) 305 lb 8 oz (138.6 kg), SpO2 95%. Wt Readings from Last 3 Encounters:  10/09/23 (!) 305 lb 8 oz (138.6 kg)  06/08/23 (!) 302 lb (137 kg)  01/25/23 298 lb 4.8 oz (135.3 kg)   Body mass index is 52.44 kg/m. Performance status (ECOG): 1 - Symptomatic but completely ambulatory Physical Exam Constitutional:      Appearance: Normal appearance. She is not ill-appearing.  HENT:     Mouth/Throat:     Mouth: Mucous membranes are moist.     Pharynx: Oropharynx is clear. No oropharyngeal exudate or posterior oropharyngeal erythema.  Cardiovascular:     Rate and Rhythm: Normal rate and regular rhythm.     Heart sounds: No murmur heard.    No friction rub. No gallop.  Pulmonary:     Effort: Pulmonary effort is normal. No respiratory distress.     Breath sounds: Normal breath sounds. No wheezing, rhonchi or rales.  Abdominal:     General: Bowel sounds are normal. There is no distension.     Palpations: Abdomen is soft. There is no mass.     Tenderness: There is no abdominal tenderness.  Musculoskeletal:        General: No swelling.     Right lower leg: No edema.      Left lower leg: No edema.     Comments: Left above-elbow amputation,  Lymphadenopathy:     Cervical: No cervical adenopathy.     Upper Body:     Right upper body: No supraclavicular or axillary adenopathy.     Left upper body: No supraclavicular or axillary adenopathy.     Lower Body: No right inguinal adenopathy. No left inguinal adenopathy.  Skin:    General: Skin is warm.     Coloration: Skin is not jaundiced.     Findings: No lesion or rash.  Neurological:     General: No focal deficit present.     Mental Status: She is alert and oriented to person, place, and time. Mental status is at baseline.  Psychiatric:        Mood and Affect: Mood normal.        Behavior: Behavior normal.        Thought Content: Thought content normal.    LABS:      Latest Ref Rng & Units 10/09/2023    1:53 PM 01/25/2023   12:00 AM 10/06/2022   11:52 AM  CBC  WBC 4.0 - 10.5 K/uL 10.9  10.3     9.6      Hemoglobin 12.0 - 15.0 g/dL 86.0  87.7  9.4      Hematocrit 36.0 - 46.0 % 41.7  36     30      Platelets 150 - 400 K/uL 298  406     460         This result is from an external source.    Latest Reference Range & Units 10/09/23 13:53  Iron 28 - 170 ug/dL 60  UIBC ug/dL 646  TIBC 749 - 549 ug/dL 586  Saturation Ratios 10.4 - 31.8 % 15  Ferritin 11 - 307 ng/mL 8 (L)  (L): Data is abnormally low  ASSESSMENT & PLAN:  A 53 y.o. female with iron deficiency anemia secondary to her heavy menstrual cycles.  I am pleased with her hemoglobin today, which is the highest it has been  since I been following her.  Despite this, her iron parameters today still suggest she has inadequate iron stores.  Based upon this, I will arrange for her to receive another course of IV iron over these next few weeks.  I will see her back in 4 months to reassess her hemoglobin and iron levels after receiving yet another course of IV iron.  The patient understands all the plans discussed today and is in agreement with  them.  Dera Vanaken DELENA Kerns, MD

## 2023-10-09 ENCOUNTER — Inpatient Hospital Stay: Payer: PRIVATE HEALTH INSURANCE

## 2023-10-09 ENCOUNTER — Telehealth: Payer: Self-pay | Admitting: Oncology

## 2023-10-09 ENCOUNTER — Other Ambulatory Visit: Payer: Self-pay | Admitting: Oncology

## 2023-10-09 ENCOUNTER — Inpatient Hospital Stay: Payer: PRIVATE HEALTH INSURANCE | Attending: Oncology | Admitting: Oncology

## 2023-10-09 VITALS — BP 146/75 | HR 77 | Temp 98.8°F | Resp 16 | Ht 64.0 in | Wt 305.5 lb

## 2023-10-09 DIAGNOSIS — N920 Excessive and frequent menstruation with regular cycle: Secondary | ICD-10-CM | POA: Diagnosis present

## 2023-10-09 DIAGNOSIS — D500 Iron deficiency anemia secondary to blood loss (chronic): Secondary | ICD-10-CM

## 2023-10-09 DIAGNOSIS — Z79620 Long term (current) use of immunosuppressive biologic: Secondary | ICD-10-CM | POA: Diagnosis not present

## 2023-10-09 LAB — CMP (CANCER CENTER ONLY)
ALT: 9 U/L (ref 0–44)
AST: 15 U/L (ref 15–41)
Albumin: 3.8 g/dL (ref 3.5–5.0)
Alkaline Phosphatase: 53 U/L (ref 38–126)
Anion gap: 11 (ref 5–15)
BUN: 10 mg/dL (ref 6–20)
CO2: 27 mmol/L (ref 22–32)
Calcium: 9.2 mg/dL (ref 8.9–10.3)
Chloride: 99 mmol/L (ref 98–111)
Creatinine: 0.92 mg/dL (ref 0.44–1.00)
GFR, Estimated: >60 mL/min (ref >60)
Glucose, Bld: 114 mg/dL — ABNORMAL HIGH (ref 70–99)
Potassium: 4 mmol/L (ref 3.5–5.1)
Sodium: 137 mmol/L (ref 135–145)
Total Bilirubin: 0.2 mg/dL (ref 0.0–1.2)
Total Protein: 7.4 g/dL (ref 6.5–8.1)

## 2023-10-09 LAB — CBC WITH DIFFERENTIAL (CANCER CENTER ONLY)
Abs Immature Granulocytes: 0.04 K/uL (ref 0.00–0.07)
Basophils Absolute: 0.1 K/uL (ref 0.0–0.1)
Basophils Relative: 1 %
Eosinophils Absolute: 0.2 K/uL (ref 0.0–0.5)
Eosinophils Relative: 2 %
HCT: 41.7 % (ref 36.0–46.0)
Hemoglobin: 13.9 g/dL (ref 12.0–15.0)
Immature Granulocytes: 0 %
Lymphocytes Relative: 21 %
Lymphs Abs: 2.3 K/uL (ref 0.7–4.0)
MCH: 32.6 pg (ref 26.0–34.0)
MCHC: 33.3 g/dL (ref 30.0–36.0)
MCV: 97.7 fL (ref 80.0–100.0)
Monocytes Absolute: 0.8 K/uL (ref 0.1–1.0)
Monocytes Relative: 8 %
Neutro Abs: 7.4 K/uL (ref 1.7–7.7)
Neutrophils Relative %: 68 %
Platelet Count: 298 K/uL (ref 150–400)
RBC: 4.27 MIL/uL (ref 3.87–5.11)
RDW: 14.4 % (ref 11.5–15.5)
WBC Count: 10.9 K/uL — ABNORMAL HIGH (ref 4.0–10.5)
nRBC: 0 % (ref 0.0–0.2)

## 2023-10-09 LAB — URINALYSIS, COMPLETE (UACMP) WITH MICROSCOPIC
Bilirubin Urine: NEGATIVE
Glucose, UA: NEGATIVE mg/dL
Ketones, ur: NEGATIVE mg/dL
Leukocytes,Ua: NEGATIVE
Nitrite: NEGATIVE
Protein, ur: NEGATIVE mg/dL
Specific Gravity, Urine: 1.02 (ref 1.005–1.030)
pH: 6.5 (ref 5.0–8.0)

## 2023-10-09 LAB — IRON AND TIBC
Iron: 60 ug/dL (ref 28–170)
Saturation Ratios: 15 % (ref 10.4–31.8)
TIBC: 413 ug/dL (ref 250–450)
UIBC: 353 ug/dL

## 2023-10-09 LAB — TSH: TSH: 4.166 u[IU]/mL (ref 0.350–4.500)

## 2023-10-09 LAB — FERRITIN: Ferritin: 8 ng/mL — ABNORMAL LOW (ref 11–307)

## 2023-10-09 NOTE — Telephone Encounter (Signed)
 Patient has been scheduled for follow-up visit per 10/09/23 LOS.  Pt given an appt calendar with date and time.

## 2023-10-10 ENCOUNTER — Encounter: Payer: Self-pay | Admitting: Oncology

## 2023-10-10 ENCOUNTER — Other Ambulatory Visit: Payer: Self-pay | Admitting: Hematology and Oncology

## 2023-10-10 ENCOUNTER — Telehealth: Payer: Self-pay | Admitting: Oncology

## 2023-10-10 ENCOUNTER — Telehealth: Payer: Self-pay

## 2023-10-10 MED ORDER — NITROFURANTOIN MONOHYD MACRO 100 MG PO CAPS: 100.0000 mg | ORAL_CAPSULE | Freq: Two times a day (BID) | ORAL | 0 refills | Status: DC

## 2023-10-10 NOTE — Telephone Encounter (Signed)
 Dr Ezzard:This pt also needs another course of iv iron. Please let her know and arrange within the forthcoming weeks    Latest Reference Range & Units 10/09/23 13:53  Iron 28 - 170 ug/dL 60  UIBC ug/dL 646  TIBC 749 - 549 ug/dL 586  Saturation Ratios 10.4 - 31.8 % 15  Ferritin 11 - 307 ng/mL 8 (L)  (L): Data is abnormally low  Tina Parsons,NP reviewed UA results: She can use an antibiotic. I will send in for her now.

## 2023-10-10 NOTE — Telephone Encounter (Signed)
 Patient has been scheduled. Aware of appt date and time.    Per Dr. Valaria Kerns: This pt also needs another course of iv iron.  Please let her know and arrange within the forthcoming weeks also, have her see me back in 4 months (instead of the previous 6)....thx

## 2023-10-11 ENCOUNTER — Inpatient Hospital Stay

## 2023-10-11 VITALS — BP 130/62 | HR 77 | Temp 98.0°F | Resp 20

## 2023-10-11 DIAGNOSIS — D500 Iron deficiency anemia secondary to blood loss (chronic): Secondary | ICD-10-CM

## 2023-10-11 LAB — URINE CULTURE: Culture: NO GROWTH

## 2023-10-11 MED ORDER — SODIUM CHLORIDE 0.9 % IV SOLN: INTRAVENOUS | Status: DC

## 2023-10-11 MED ORDER — SODIUM CHLORIDE 0.9 % IV SOLN
510.0000 mg | Freq: Once | INTRAVENOUS | Status: AC
  Administered 2023-10-11: 510 mg via INTRAVENOUS
  Filled 2023-10-11: qty 510

## 2023-10-11 NOTE — Patient Instructions (Addendum)
 Ferumoxytol  Injection What is this medication? FERUMOXYTOL  (FER ue MOX i tol) treats low levels of iron in your body (iron deficiency anemia). Iron is a mineral that plays an important role in making red blood cells, which carry oxygen from your lungs to the rest of your body. This medicine may be used for other purposes; ask your health care provider or pharmacist if you have questions. COMMON BRAND NAME(S): Feraheme  What should I tell my care team before I take this medication? They need to know if you have any of these conditions: Anemia not caused by low iron levels High levels of iron in the blood Magnetic resonance imaging (MRI) test scheduled An unusual or allergic reaction to iron, other medications, foods, dyes, or preservatives Pregnant or trying to get pregnant Breastfeeding How should I use this medication? This medication is injected into a vein. It is given by your care team in a hospital or clinic setting. Talk to your care team the use of this medication in children. Special care may be needed. Overdosage: If you think you have taken too much of this medicine contact a poison control center or emergency room at once. NOTE: This medicine is only for you. Do not share this medicine with others. What if I miss a dose? It is important not to miss your dose. Call your care team if you are unable to keep an appointment. What may interact with this medication? Other iron products This list may not describe all possible interactions. Give your health care provider a list of all the medicines, herbs, non-prescription drugs, or dietary supplements you use. Also tell them if you smoke, drink alcohol, or use illegal drugs. Some items may interact with your medicine. What should I watch for while using this medication? Visit your care team for regular checks on your progress. Tell your care team if your symptoms do not start to get better or if they get worse. You may need blood work done  while you are taking this medication. You may need to eat more foods that contain iron. Talk to your care team. Foods that contain iron include whole grains or cereals, dried fruits, beans, peas, leafy green vegetables, and organ meats (liver, kidney). What side effects may I notice from receiving this medication? Side effects that you should report to your care team as soon as possible: Allergic reactions--skin rash, itching, hives, swelling of the face, lips, tongue, or throat Low blood pressure--dizziness, feeling faint or lightheaded, blurry vision Shortness of breath Side effects that usually do not require medical attention (report to your care team if they continue or are bothersome): Flushing Headache Joint pain Muscle pain Nausea Pain, redness, or irritation at injection site This list may not describe all possible side effects. Call your doctor for medical advice about side effects. You may report side effects to FDA at 1-800-FDA-1088. Where should I keep my medication? This medication is given in a hospital or clinic. It will not be stored at home. NOTE: This sheet is a summary. It may not cover all possible information. If you have questions about this medicine, talk to your doctor, pharmacist, or health care provider.  2024 Elsevier/Gold Standard (2022-11-08 00:00:00)Iron-Rich Diet  Iron is a mineral that helps your body produce hemoglobin. Hemoglobin is a protein in red blood cells that carries oxygen to your body's tissues. Eating too little iron may cause you to feel weak and tired, and it can increase your risk of infection. Iron is naturally found in  many foods, and many foods have iron added to them (are iron-fortified). You may need to follow an iron-rich diet if you do not have enough iron in your body due to certain medical conditions. The amount of iron that you need each day depends on your age, your sex, and any medical conditions you have. Follow instructions from your  health care provider or a dietitian about how much iron you should eat each day. What are tips for following this plan? Reading food labels Check food labels to see how many milligrams (mg) of iron are in each serving. Cooking Cook foods in pots and pans that are made from iron. Take these steps to make it easier for your body to absorb iron from certain foods: Soak beans overnight before cooking. Soak whole grains overnight and drain them before using. Ferment flours before baking, such as by using yeast in bread dough. Meal planning When you eat foods that contain iron, you should eat them with foods that are high in vitamin C. These include oranges, peppers, tomatoes, potatoes, and mangoes. Vitamin C helps your body absorb iron. Certain foods and drinks prevent your body from absorbing iron properly. Avoid eating these foods in the same meal as iron-rich foods or with iron supplements. These foods include: Coffee, black tea, and red wine. Milk, dairy products, and foods that are high in calcium. Beans and soybeans. Whole grains. General information Take iron supplements only as told by your health care provider. An overdose of iron can be life-threatening. If you were prescribed iron supplements, take them with orange juice or a vitamin C supplement. When you eat iron-fortified foods or take an iron supplement, you should also eat foods that naturally contain iron, such as meat, poultry, and fish. Eating naturally iron-rich foods helps your body absorb the iron that is added to other foods or contained in a supplement. Iron from animal sources is better absorbed than iron from plant sources. What foods should I eat? Vegetables Spinach (cooked). Green peas. Broccoli. Fermented vegetables. Eat vegetables high in vitamin C, such as leafy greens, potatoes, bell peppers, and tomatoes, with iron-rich foods. Grains Iron-fortified breakfast cereal. Iron-fortified whole-wheat bread. Enriched  rice. Sprouted grains. Meats and other proteins Beef liver. Beef. [place]. Chicken. Oysters. Shrimp. Tuna. Sardines. Chickpeas. Nuts. Tofu. Pumpkin seeds. Beverages Tomato juice. Fresh orange juice. Prune juice. Hibiscus tea. Iron-fortified instant breakfast shakes. Sweets and desserts Blackstrap molasses. Seasonings and condiments Tahini. Fermented soy sauce. Other foods Wheat germ. The items listed above may not be a complete list of recommended foods and beverages. Contact a dietitian for more information. What foods should I limit? These are foods that should be limited while eating iron-rich foods as they can reduce the absorption of iron in your body. Grains Whole grains. Bran cereal. Bran flour. Meats and other proteins Soybeans. Products made from soy protein. Black beans. Lentils. Mung beans. Split peas. Dairy Milk. Cream. Cheese. Yogurt. Cottage cheese. Beverages Coffee. Black tea. Red wine. Sweets and desserts Cocoa. Chocolate. Ice cream. Seasonings and condiments Basil. Oregano. Large amounts of parsley. The items listed above may not be a complete list of foods and beverages you should limit. Contact a dietitian for more information. Summary Iron is a mineral that helps your body produce hemoglobin. Hemoglobin is a protein in red blood cells that carries oxygen to your body's tissues. Iron is naturally found in many foods, and many foods have iron added to them (are iron-fortified). When you eat foods that contain iron,  you should eat them with foods that are high in vitamin C. Vitamin C helps your body absorb iron. Certain foods and drinks prevent your body from absorbing iron properly, such as whole grains and dairy products. You should avoid eating these foods in the same meal as iron-rich foods or with iron supplements. This information is not intended to replace advice given to you by your health care provider. Make sure you discuss any questions you have with your  health care provider. Document Revised: 03/04/2023 Document Reviewed: 03/04/2023 Elsevier Patient Education  2025 [employer].

## 2023-10-12 ENCOUNTER — Telehealth: Payer: Self-pay

## 2023-10-12 NOTE — Telephone Encounter (Signed)
 Dr Ezzard sent pt in antibiotic for UA. Pt states she thinks she is allergic to it. She has to go to ER for breathing treatments. She is requesting Dr Ezzard to send in different antibiotic to [facility].SABRA

## 2023-10-18 ENCOUNTER — Inpatient Hospital Stay: Payer: PRIVATE HEALTH INSURANCE

## 2023-10-18 VITALS — BP 114/66 | HR 77 | Temp 98.4°F | Resp 16

## 2023-10-18 DIAGNOSIS — D500 Iron deficiency anemia secondary to blood loss (chronic): Secondary | ICD-10-CM

## 2023-10-18 MED ORDER — SODIUM CHLORIDE 0.9 % IV SOLN
510.0000 mg | Freq: Once | INTRAVENOUS | Status: AC
  Administered 2023-10-18: 510 mg via INTRAVENOUS
  Filled 2023-10-18: qty 510

## 2023-10-18 MED ORDER — SODIUM CHLORIDE 0.9 % IV SOLN
INTRAVENOUS | Status: DC
Start: 2023-10-18 — End: 2023-10-18

## 2023-10-18 NOTE — Patient Instructions (Signed)

## 2024-02-07 NOTE — Progress Notes (Signed)
 [facility] [address] [place],  [place]  [address] [phone]  Clinic Day:  02/08/2024  Referring physician: Pandora Therisa RAMAN, NP   HISTORY OF PRESENT ILLNESS:  The patient is a 53 y.o. female  who I recently began seeing for iron deficiency anemia secondary to heavy menstrual cycles.  She comes in today to reassess her iron and hemoglobin levels after receiving another course of IV iron in July 2025.  Since her last visit, the patient has felt much better.  Although she still has menstrual cycles, they have become very erratic.  Furthermore, she claims they are not as heavy as they were previously.  She continues to deny having other overt forms of blood loss.   PHYSICAL EXAM:  Blood pressure (!) 153/82, pulse 86, temperature 98.9 F (37.2 C), temperature source Oral, resp. rate 16, height 5' 4 (1.626 m), weight (!) 304 lb 9.6 oz (138.2 kg), SpO2 94%. Wt Readings from Last 3 Encounters:  02/08/24 (!) 304 lb 9.6 oz (138.2 kg)  10/09/23 (!) 305 lb 8 oz (138.6 kg)  06/08/23 (!) 302 lb (137 kg)   Body mass index is 52.28 kg/m. Performance status (ECOG): 1 - Symptomatic but completely ambulatory Physical Exam Constitutional:      Appearance: Normal appearance. She is not ill-appearing.  HENT:     Mouth/Throat:     Mouth: Mucous membranes are moist.     Pharynx: Oropharynx is clear. No oropharyngeal exudate or posterior oropharyngeal erythema.  Cardiovascular:     Rate and Rhythm: Normal rate and regular rhythm.     Heart sounds: No murmur heard.    No friction rub. No gallop.  Pulmonary:     Effort: Pulmonary effort is normal. No respiratory distress.     Breath sounds: Normal breath sounds. No wheezing, rhonchi or rales.  Abdominal:     General: Bowel sounds are normal. There is no distension.     Palpations: Abdomen is soft. There is no mass.     Tenderness: There is no abdominal tenderness.  Musculoskeletal:        General: No swelling.      Right lower leg: No edema.     Left lower leg: No edema.     Comments: Left above-elbow amputation,  Lymphadenopathy:     Cervical: No cervical adenopathy.     Upper Body:     Right upper body: No supraclavicular or axillary adenopathy.     Left upper body: No supraclavicular or axillary adenopathy.     Lower Body: No right inguinal adenopathy. No left inguinal adenopathy.  Skin:    General: Skin is warm.     Coloration: Skin is not jaundiced.     Findings: No lesion or rash.  Neurological:     General: No focal deficit present.     Mental Status: She is alert and oriented to person, place, and time. Mental status is at baseline.  Psychiatric:        Mood and Affect: Mood normal.        Behavior: Behavior normal.        Thought Content: Thought content normal.    LABS:      Latest Ref Rng & Units 02/08/2024    2:24 PM 10/09/2023    1:53 PM 01/25/2023   12:00 AM  CBC  WBC 4.0 - 10.5 K/uL 10.5  10.9  10.3      Hemoglobin 12.0 - 15.0 g/dL 84.1  13.9  12.2      Hematocrit 36.0 - 46.0 % 46.5  41.7  36      Platelets 150 - 400 K/uL 287  298  406         This result is from an external source.    Latest Reference Range & Units 10/09/23 13:53 02/08/24 14:24  Iron 28 - 170 ug/dL 60 78  UIBC ug/dL 646 728  TIBC 749 - 549 ug/dL 586 650  Saturation Ratios 10.4 - 31.8 % 15 22  Ferritin 11 - 307 ng/mL 8 (L) 47  (L): Data is abnormally low  ASSESSMENT & PLAN:  A 53 y.o. female with iron deficiency anemia secondary to her heavy menstrual cycles.  I am pleased with the improvement in both her hemoglobin and iron levels today.  Clinically, the patient appears to be doing well.  I will see her back in 6 months for repeat clinical assessment.  The patient understands all the plans discussed today and is in agreement with them.  Leiliana Foody DELENA Kerns, MD

## 2024-02-08 ENCOUNTER — Other Ambulatory Visit: Payer: Self-pay | Admitting: Oncology

## 2024-02-08 ENCOUNTER — Inpatient Hospital Stay: Payer: PRIVATE HEALTH INSURANCE | Attending: Oncology

## 2024-02-08 ENCOUNTER — Inpatient Hospital Stay: Payer: PRIVATE HEALTH INSURANCE | Admitting: Oncology

## 2024-02-08 ENCOUNTER — Telehealth: Payer: Self-pay | Admitting: Oncology

## 2024-02-08 VITALS — BP 153/82 | HR 86 | Temp 98.9°F | Resp 16 | Ht 64.0 in | Wt 304.6 lb

## 2024-02-08 DIAGNOSIS — N920 Excessive and frequent menstruation with regular cycle: Secondary | ICD-10-CM | POA: Diagnosis present

## 2024-02-08 DIAGNOSIS — D500 Iron deficiency anemia secondary to blood loss (chronic): Secondary | ICD-10-CM

## 2024-02-08 LAB — CBC WITH DIFFERENTIAL (CANCER CENTER ONLY)
Abs Immature Granulocytes: 0.03 K/uL (ref 0.00–0.07)
Basophils Absolute: 0.1 K/uL (ref 0.0–0.1)
Basophils Relative: 1 %
Eosinophils Absolute: 0.3 K/uL (ref 0.0–0.5)
Eosinophils Relative: 3 %
HCT: 46.5 % — ABNORMAL HIGH (ref 36.0–46.0)
Hemoglobin: 15.8 g/dL — ABNORMAL HIGH (ref 12.0–15.0)
Immature Granulocytes: 0 %
Lymphocytes Relative: 26 %
Lymphs Abs: 2.7 K/uL (ref 0.7–4.0)
MCH: 34.3 pg — ABNORMAL HIGH (ref 26.0–34.0)
MCHC: 34 g/dL (ref 30.0–36.0)
MCV: 100.9 fL — ABNORMAL HIGH (ref 80.0–100.0)
Monocytes Absolute: 0.8 K/uL (ref 0.1–1.0)
Monocytes Relative: 8 %
Neutro Abs: 6.6 K/uL (ref 1.7–7.7)
Neutrophils Relative %: 62 %
Platelet Count: 287 K/uL (ref 150–400)
RBC: 4.61 MIL/uL (ref 3.87–5.11)
RDW: 13 % (ref 11.5–15.5)
WBC Count: 10.5 K/uL (ref 4.0–10.5)
nRBC: 0 % (ref 0.0–0.2)

## 2024-02-08 LAB — IRON AND TIBC
Iron: 78 ug/dL (ref 28–170)
Saturation Ratios: 22 % (ref 10.4–31.8)
TIBC: 349 ug/dL (ref 250–450)
UIBC: 271 ug/dL

## 2024-02-08 LAB — FERRITIN: Ferritin: 47 ng/mL (ref 11–307)

## 2024-02-08 NOTE — Telephone Encounter (Signed)
 Patient has been scheduled for follow-up visit per 02/08/24 LOS.  Pt aware of scheduled appt details.

## 2024-02-09 ENCOUNTER — Encounter: Payer: Self-pay | Admitting: Oncology

## 2024-04-10 ENCOUNTER — Other Ambulatory Visit: Payer: PRIVATE HEALTH INSURANCE

## 2024-04-10 ENCOUNTER — Ambulatory Visit: Payer: PRIVATE HEALTH INSURANCE | Admitting: Oncology

## 2024-08-07 ENCOUNTER — Inpatient Hospital Stay: Payer: PRIVATE HEALTH INSURANCE | Admitting: Oncology

## 2024-08-07 ENCOUNTER — Inpatient Hospital Stay: Payer: PRIVATE HEALTH INSURANCE
